# Patient Record
Sex: Male | Born: 1935
Health system: Southern US, Community
[De-identification: ages and names within clinical notes are randomized; demographics above are authoritative.]

## PROBLEM LIST (undated history)

## (undated) DIAGNOSIS — F0280 Dementia in other diseases classified elsewhere without behavioral disturbance: Secondary | ICD-10-CM

## (undated) DIAGNOSIS — G309 Alzheimer's disease, unspecified: Secondary | ICD-10-CM

## (undated) DIAGNOSIS — N183 Chronic kidney disease, stage 3 unspecified: Secondary | ICD-10-CM

## (undated) DIAGNOSIS — I10 Essential (primary) hypertension: Secondary | ICD-10-CM

## (undated) DIAGNOSIS — C61 Malignant neoplasm of prostate: Secondary | ICD-10-CM

## (undated) DIAGNOSIS — I5020 Unspecified systolic (congestive) heart failure: Secondary | ICD-10-CM

## (undated) DIAGNOSIS — I4819 Other persistent atrial fibrillation: Secondary | ICD-10-CM

## (undated) DIAGNOSIS — C189 Malignant neoplasm of colon, unspecified: Secondary | ICD-10-CM

## (undated) HISTORY — PX: HERNIA REPAIR: SHX51

## (undated) HISTORY — PX: COLON SURGERY: SHX602

---

## 2014-05-17 ENCOUNTER — Inpatient Hospital Stay: Payer: Self-pay | Admitting: Internal Medicine

## 2014-05-17 LAB — COMPREHENSIVE METABOLIC PANEL
Albumin: 2.4 g/dL — ABNORMAL LOW (ref 3.4–5.0)
Alkaline Phosphatase: 67 U/L
Anion Gap: 10 (ref 7–16)
BUN: 102 mg/dL — ABNORMAL HIGH (ref 7–18)
Bilirubin,Total: 0.5 mg/dL (ref 0.2–1.0)
Calcium, Total: 8.9 mg/dL (ref 8.5–10.1)
Chloride: 117 mmol/L — ABNORMAL HIGH (ref 98–107)
Co2: 20 mmol/L — ABNORMAL LOW (ref 21–32)
Creatinine: 3.14 mg/dL — ABNORMAL HIGH (ref 0.60–1.30)
EGFR (African American): 21 — ABNORMAL LOW
GFR CALC NON AF AMER: 18 — AB
Glucose: 94 mg/dL (ref 65–99)
OSMOLALITY: 324 (ref 275–301)
Potassium: 5.5 mmol/L — ABNORMAL HIGH (ref 3.5–5.1)
SGOT(AST): 38 U/L — ABNORMAL HIGH (ref 15–37)
SGPT (ALT): 18 U/L
Sodium: 147 mmol/L — ABNORMAL HIGH (ref 136–145)
Total Protein: 7.4 g/dL (ref 6.4–8.2)

## 2014-05-17 LAB — URINALYSIS, COMPLETE
Bilirubin,UR: NEGATIVE
Glucose,UR: NEGATIVE mg/dL (ref 0–75)
Hyaline Cast: 59
Ketone: NEGATIVE
Nitrite: NEGATIVE
Ph: 5 (ref 4.5–8.0)
Protein: 100
RBC,UR: 24 /HPF (ref 0–5)
Specific Gravity: 1.015 (ref 1.003–1.030)
Squamous Epithelial: 1
Transitional Epi: 6
WBC UR: 2133 /HPF (ref 0–5)

## 2014-05-17 LAB — CBC WITH DIFFERENTIAL/PLATELET
BASOS ABS: 0.1 10*3/uL (ref 0.0–0.1)
Basophil %: 0.8 %
EOS PCT: 0.1 %
Eosinophil #: 0 10*3/uL (ref 0.0–0.7)
HCT: 38.7 % — ABNORMAL LOW (ref 40.0–52.0)
HGB: 12 g/dL — AB (ref 13.0–18.0)
Lymphocyte #: 1.1 10*3/uL (ref 1.0–3.6)
Lymphocyte %: 12.1 %
MCH: 28.4 pg (ref 26.0–34.0)
MCHC: 31 g/dL — AB (ref 32.0–36.0)
MCV: 92 fL (ref 80–100)
Monocyte #: 1.1 x10 3/mm — ABNORMAL HIGH (ref 0.2–1.0)
Monocyte %: 11.7 %
NEUTROS ABS: 7.1 10*3/uL — AB (ref 1.4–6.5)
Neutrophil %: 75.3 %
Platelet: 116 10*3/uL — ABNORMAL LOW (ref 150–440)
RBC: 4.23 10*6/uL — AB (ref 4.40–5.90)
RDW: 20.5 % — ABNORMAL HIGH (ref 11.5–14.5)
WBC: 9.5 10*3/uL (ref 3.8–10.6)

## 2014-05-17 LAB — TROPONIN I
Troponin-I: 0.52 ng/mL — ABNORMAL HIGH
Troponin-I: 0.52 ng/mL — ABNORMAL HIGH

## 2014-05-17 LAB — BASIC METABOLIC PANEL
Anion Gap: 8 (ref 7–16)
BUN: 103 mg/dL — AB (ref 7–18)
CALCIUM: 9.4 mg/dL (ref 8.5–10.1)
CO2: 24 mmol/L (ref 21–32)
CREATININE: 2.98 mg/dL — AB (ref 0.60–1.30)
Chloride: 113 mmol/L — ABNORMAL HIGH (ref 98–107)
EGFR (Non-African Amer.): 19 — ABNORMAL LOW
GFR CALC AF AMER: 22 — AB
GLUCOSE: 120 mg/dL — AB (ref 65–99)
Osmolality: 322 (ref 275–301)
POTASSIUM: 5.1 mmol/L (ref 3.5–5.1)
Sodium: 145 mmol/L (ref 136–145)

## 2014-05-17 LAB — CK-MB: CK-MB: 3.2 ng/mL (ref 0.5–3.6)

## 2014-05-17 LAB — CK TOTAL AND CKMB (NOT AT ARMC)
CK, Total: 283 U/L
CK-MB: 2.1 ng/mL (ref 0.5–3.6)

## 2014-05-17 LAB — TSH: THYROID STIMULATING HORM: 3.77 u[IU]/mL

## 2014-05-17 LAB — CLOSTRIDIUM DIFFICILE(ARMC)

## 2014-05-18 LAB — CBC WITH DIFFERENTIAL/PLATELET
BASOS ABS: 0.1 10*3/uL (ref 0.0–0.1)
Basophil %: 0.6 %
Eosinophil #: 0 10*3/uL (ref 0.0–0.7)
Eosinophil %: 0.4 %
HCT: 34.3 % — ABNORMAL LOW (ref 40.0–52.0)
HGB: 10.7 g/dL — AB (ref 13.0–18.0)
LYMPHS PCT: 9.3 %
Lymphocyte #: 0.8 10*3/uL — ABNORMAL LOW (ref 1.0–3.6)
MCH: 28.5 pg (ref 26.0–34.0)
MCHC: 31.3 g/dL — ABNORMAL LOW (ref 32.0–36.0)
MCV: 91 fL (ref 80–100)
MONO ABS: 1 x10 3/mm (ref 0.2–1.0)
Monocyte %: 10.7 %
Neutrophil #: 7.1 10*3/uL — ABNORMAL HIGH (ref 1.4–6.5)
Neutrophil %: 79 %
PLATELETS: 114 10*3/uL — AB (ref 150–440)
RBC: 3.77 10*6/uL — AB (ref 4.40–5.90)
RDW: 20 % — ABNORMAL HIGH (ref 11.5–14.5)
WBC: 9.1 10*3/uL (ref 3.8–10.6)

## 2014-05-18 LAB — BASIC METABOLIC PANEL
ANION GAP: 11 (ref 7–16)
BUN: 107 mg/dL — AB (ref 7–18)
CALCIUM: 8.5 mg/dL (ref 8.5–10.1)
CHLORIDE: 110 mmol/L — AB (ref 98–107)
CO2: 25 mmol/L (ref 21–32)
CREATININE: 2.96 mg/dL — AB (ref 0.60–1.30)
GFR CALC AF AMER: 22 — AB
GFR CALC NON AF AMER: 19 — AB
Glucose: 145 mg/dL — ABNORMAL HIGH (ref 65–99)
Osmolality: 327 (ref 275–301)
Potassium: 4.8 mmol/L (ref 3.5–5.1)
Sodium: 146 mmol/L — ABNORMAL HIGH (ref 136–145)

## 2014-05-18 LAB — CK-MB: CK-MB: 3.6 ng/mL (ref 0.5–3.6)

## 2014-05-18 LAB — MAGNESIUM: Magnesium: 2.3 mg/dL

## 2014-05-18 LAB — TROPONIN I: Troponin-I: 0.47 ng/mL — ABNORMAL HIGH

## 2014-05-19 LAB — URINE CULTURE

## 2014-05-19 LAB — BASIC METABOLIC PANEL
Anion Gap: 5 — ABNORMAL LOW (ref 7–16)
BUN: 97 mg/dL — ABNORMAL HIGH (ref 7–18)
CHLORIDE: 105 mmol/L (ref 98–107)
CO2: 23 mmol/L (ref 21–32)
Calcium, Total: 8.1 mg/dL — ABNORMAL LOW (ref 8.5–10.1)
Creatinine: 2.59 mg/dL — ABNORMAL HIGH (ref 0.60–1.30)
EGFR (African American): 26 — ABNORMAL LOW
EGFR (Non-African Amer.): 22 — ABNORMAL LOW
Glucose: 120 mg/dL — ABNORMAL HIGH (ref 65–99)
Osmolality: 298 (ref 275–301)
Potassium: 3.9 mmol/L (ref 3.5–5.1)
Sodium: 133 mmol/L — ABNORMAL LOW (ref 136–145)

## 2015-01-26 NOTE — Discharge Summary (Signed)
PATIENT NAMELARENCE, Dale Williams MR#:  [id number] DATE OF BIRTH:  17-Mar-1931  DATE OF ADMISSION:  05/17/2014 DATE OF DISCHARGE:  05/19/2014  ADMISSION DIAGNOSIS:  Acute renal failure.   DISCHARGE DIAGNOSES: 1.  Acute renal failure, likely the patient has chronic kidney disease, stage 3.  2.  Escherichia coli, urinary tract infection, pansensitive.   CONSULTATIONS:  Nephrology.   PERTINENT LABORATORIES AT DISCHARGE: Sodium 133, potassium 3.9, chloride 105, bicarbonate 23, BUN 97, creatinine 2.5, glucose 127,   Renal ultrasound showed no evidence of hydronephrosis.   Urine cultures positive for Escherichia coli, pansensitive.   HOSPITAL COURSE: An 81 year old male who was brought in on August 13, for altered mental status. For further details, please refer to the H and P.  1.  Acute renal failure. The patient was noted to have acute renal failure. His creatinine at admission was 2.99. I suspect he has some underlying chronic kidney disease as per the nephrology consultation, as he is on calcitriol as an outpatient. His medications as an outpatient are also renally dosed. His creatinine did improve with some IV fluids to 2.6, but again we feel that he probably has stage 3 chronic kidney disease. He will follow up next week with Dr. Clayborn Bigness for further work-up and evaluation.  2.  Confusion, likely secondary to his dementia.  I did ask psychiatry to see the patient in consultation to assess his ability to make decisions. He is capable of making his own medical decisions, as well as placement decisions as per psychiatry consultation.  His depression seems to be very mild.  3.  Hyperkalemia on admission resolved.  4. Escherichia coli urinary tract infection was pansensitive. The patient is ALLERGIC TO PENICILLIN so he is placed on Levaquin.   5.  History of hypertension. His blood pressure medications have been on hold due to low-normal blood pressures.   DISCHARGE MEDICATIONS: 1.  Allopurinol 300  mg daily.  2.  Calcitriol 0.5 mg daily.  3.  Donepezil 10 mg at bedtime.  4.  Tamsulosin 0.4 mg daily.  5.  Levaquin 250 mg q. 48 hours x10 days.   DISCHARGE INSTRUCTIONS:  The patient will stop taking Norvasc, lisinopril for now due to low-normal blood pressure.   DISCHARGE DIET: Regular. Discharge Supplement: Ensure t.i.d.   DISCHARGE ACTIVITY: As tolerated.   DISCHARGE REFERRAL: Physical therapy.   FOLLOWUP:  The patient should follow up with Dr. Lockie Mola in 1-2 weeks and Dr. Clayborn Bigness in 1-2 days.  Blood pressure should be checked daily.    TIME SPENT: 35 minutes.  DISPOSITION:  The patient is stable for discharge.    ____________________________ Towanda Hornstein P. Benjie Karvonen, MD spm:DT D: 05/19/2014 10:00:56 ET T: 05/19/2014 13:47:15 ET JOB#: [id number]  cc: Olie Dibert P. Benjie Karvonen, MD, <Dictator> Donell Beers Alvena Kiernan MD ELECTRONICALLY SIGNED 05/19/2014 21:24

## 2015-01-26 NOTE — H&P (Signed)
PATIENT NAME:  Dale Williams, Dale Williams MR#:  [id number] DATE OF BIRTH:  October 12, 1930  DATE OF ADMISSION:  05/17/2014  PRIMARY CARE PHYSICIAN:  Wells Guiles B. Petra Kuba, MD.  REQUESTING PHYSICIAN: Conni Slipper, MD.  CHIEF COMPLAINT:  Altered mental status.   HISTORY OF PRESENT ILLNESS: The patient is an 81 year old male, who lives alone and was found by his neighbor, as he was not coming out of his home for several days.  They opened the door was found sitting on his couch, seemed confused and dehydrated. EMS was called along with police and he was brought down to the Emergency Department. He was complaining of pain with urination, but was not understanding why he is being brought to the hospital.  While  in the ED, he denies any other symptoms and he is being admitted for further evaluation and management, as his creatinine was found to be at 3.14 with BUN of 102, sodium 147 and potassium of 5.5. His troponin was also 0.52.   PAST MEDICAL HISTORY: 1.  Cancer.  2.  Prostrate cancer.  3.  Hypertension.   ALLERGIES: PENICILLIN.   SOCIAL HISTORY: Smokes 1 pack of cigarettes daily for many years. Occasional alcohol. No IV drugs of abuse. He used to Energy manager.   FAMILY HISTORY: Mother had heart disease.   MEDICATIONS AT HOME: 1.  Tamsulosin 0.4 mg p.o. daily.  2.  Lisinopril 5 mg p.o. daily.  3.  Donepezil 10 mg p.o. daily. 4.  Calcitriol 0.25 mcg p.o. daily.  5.  Amlodipine 10 mg p.o. daily.  6.  Allopurinol 300 mg p.o. daily.   REVIEW OF SYSTEMS:  CONSTITUTIONAL: No fever.  Positive fatigue and weakness.  EYES: No blurred or double vision.  ENT: No tinnitus or ear pain.  RESPIRATORY: No cough, hemoptysis. CARDIOVASCULAR:  No chest pain, orthopnea.  GASTROINTESTINAL:  No nausea, vomiting or diarrhea.  GENITOURINARY: Positive for dysuria and frequency. No hematuria.  ENDOCRINE: No polyuria or nocturia.  HEMATOLOGY: No anemia or bruising.  SKIN: No rash or lesion.  MUSCULOSKELETAL: No  arthritis or muscle cramp.  NEUROLOGIC: No tingling, numbness, weakness.  PSYCHIATRIC: No history of anxiety or depression.   PHYSICAL EXAMINATION: VITAL SIGNS: Temperature 98.1, heart rate 95 per minute, respirations 13 per minute, blood pressure 106/72 mmHg, saturating 98% on room air.  GENERAL: The patient is an 81 year old male lying in the bed comfortably without any acute distress.  EYES: Pupils equal, round, and reactive to light and accommodation. No scleral icterus. He does have scleral pallor.  HEENT: Head atraumatic, normocephalic. Oropharynx and nasopharynx dry and clear.  NECK: Supple. No jugular venous distention. No thyroid enlargement or tenderness.  LUNGS: Decreased breath sounds at the bases. No wheezing, rales, rhonchi, or crepitation.  CARDIOVASCULAR: S1, S2 normal. No murmurs, rubs or gallop.  ABDOMEN: Soft, nontender, nondistended. Bowel sounds present. No organomegaly or mass.  EXTREMITIES: No peripheral edema, cyanosis or clubbing.  NEUROLOGIC: Cranial nerves through II-XII intact. Muscle strength 4/5 in all extremities. Sensation intact.  PSYCHIATRIC: The patient seems alert, but not oriented. He told me the year is 1521 and did not know the current president.  He stated President Janeice Robinson.  He could tell me his birthday and also that he is in the hospital, but could not tell me the name.  SKIN: No obvious rash, lesion or ulcer.   LABORATORY PANEL: Sodium 147, potassium 5.5, chloride 117, CO2 of 20 BUN 102, creatinine 3.14. Normal LFTs except AST of 38.   Normal TSH. Normal  CBC, except hemoglobin of 12.0, hematocrit 38.7, platelets 116,000.  Troponin 0.52.   Urinalysis showed 3+ bacteria.  WBC of 2133, leukocyte esterase 3+. WBC in clumps present.   RADIOLOGY DATA:  Chest x-ray in the ED showed borderline cardiomegaly.  Otherwise,  no acute cardiopulmonary disease.   CT scan of the head without contrast in the ED showed no acute intracranial abnormality. Generalized  atrophy seen.   CT scan of the abdomen and pelvis in the ED showed severe atherosclerosis. Prostatomegaly, bladder wall  thickening and stranding, compatible with cystitis. Left renal cyst. Biliary sludge or stone in the gallbladder.   EKG in the ER showed sinus rhythm with occasional PVCs, right bundle branch block. No major ST-T changes.   IMPRESSION AND PLAN: 1.  Acute renal failure, likely prerenal. We will hold nephrotoxic medication. His bladder is decompressed based on the CT scan.  Will hold her Foley at this time, unless he does not make any urine in which case I will do bladder scan and request Foley at that point.  Will obtain renal ultrasound.  Consider nephro consult if no improvement in renal function.  Will start him on aggressive IV hydration.  2.  Confusion, likely due to dehydration and acute renal failure with possible underlying dementia. 3.  Hyperkalemia/hyponatremia, likely due to severe dehydration. We will hydrate him with IV fluids and recheck his electrolytes.  4.  Elevated troponin, likely due to supply/demand ischemia. We will do serial trend.  5.  Cystitis/urinary tract infection based on urinalysis. We will obtain urine culture, start him on Levaquin.   CODE STATUS: Full code.   TOTAL TIME TAKING CARE OF THIS PATIENT: 55 minutes.   ____________________________ Lucina Mellow. Manuella Ghazi, MD vss:DT D: 05/17/2014 20:07:52 ET T: 05/17/2014 20:49:53 ET JOB#: [id number]  cc: Billye Nydam S. Manuella Ghazi, MD, <Dictator> Barbarann Ehlers. Petra Kuba, MD [place] MD ELECTRONICALLY SIGNED 05/18/2014 12:40

## 2015-01-26 NOTE — Consult Note (Signed)
PATIENT NAME:  Dale Williams, Dale Williams MR#:  [id number] DATE OF BIRTH:  Jun 12, 1931  DATE OF CONSULTATION:  05/18/2014  REFERRING PHYSICIAN:   CONSULTING PHYSICIAN:  Gonzella Lex, MD  IDENTIFYING INFORMATION AND REASON FOR CONSULTATION: An 81 year old man brought into the hospital was dehydration. Consultation for "capacity."   HISTORY OF PRESENT ILLNESS: Information obtained from the patient and the chart. We have no previous history here at the hospital prior to today. Evidently, neighbors were concerned that they had not seen him in several days and so they went into his house. He is described as having been confused when they found him there and he was brought to the hospital. I do not have any other detail presented here to suggest that he had been doing a 81 job taking care of himself at home. On interview today, the patient he is aware that he was brought in because people were concerned that they had not seen him in several days. He disputes this, saying that he had been going about his regular routine for the last few days. Denies depression. Denies changing his habits. Denies any change in his appetite. He says he had not been feeling sick recently. Essentially has no acute complaints at all.   PAST PSYCHIATRIC HISTORY: He denies any history of any psychiatric treatment in the past at all.   PAST MEDICAL HISTORY: History of colon cancer, which has been treated.  Enlarged prostate, possible prostate cancer.  High blood pressure.   SOCIAL HISTORY: Lives by himself. He says that he has a sister in law in the area who he sees regularly. He is a retired Administrator. Has adult children, but seems to stay at only occasional contact with them.   FAMILY HISTORY: No family history of mental illness.   SUBSTANCE ABUSE HISTORY: Says that he used to drink quite a bit but stopped some time ago. He is vague about how long that was. Says he does not drink now and does not abuse any other drugs.   CURRENT  MEDICATIONS: He tells me that he takes a bunch of medicine at home, but he cannot tell me the names of any them. He is aware that some of them are for high blood pressure. Cannot remember the name of the doctor.   MENTAL STATUS EXAMINATION: Adequately groomed gentleman who looks his stated age or younger. Cooperative with the interview. Very pleasant to interact with. Made good eye contact. Psychomotor activity was normal. I would note that when we came into the room, myself and a male student, the patient's genitals were exposed. It took him almost a minute to recognize this and to cover himself up. He has somewhat decreased speech rate, but normal tone. His affect is euthymic, pleasant, at times seems slightly false in its cheerfulness. Mood is stated as being okay. Thoughts are scattered and a bit disorganized. Superficially seems intact, but he tends to repeat himself frequently. He made the same mistake in conversation a few times without remembering he had done it before. Admits that he is not completely clear on why he is in the hospital. He can remember zero out of 3 objects at 3 minutes. He is alert and oriented x 4; however, knowing where he is here and the year and date. Denies any suicidal or homicidal ideation. Denies any hallucinations.   LABORATORY RESULTS: Today, magnesium 2.3. Creatinine still elevated 2.96, BUN elevated at 107. Sodium elevated at 146, glucose elevated at 145. Hematocrit 34, platelet count low at  114,000.   ASSESSMENT: An 81 year old gentleman about whom we have relatively little data. He is clearly demented to some degree, although probably only mildly. His memory is poor, but he knows where he lives and describes his daily routine well, and he interacts in an appropriate manner. There is no sign of any mood or psychotic illnesses. The question is about capacity to make decision to go home. In this case, that particularly difficult because I know very little about his home.  It is unclear whether his home situation is actually dangerous. He is possible that the patient had just been acutely sick and now has returned to his baseline. It is possible that a person with this degree of dementia may still be able to take care of themselves reasonably well. Furthermore, in this case, the question is fortunately moot, because the patient is agreeable to the plan to go to a rehabilitation facility. I asked him several times and he tells me that there is a rehabilitation facility here in town, possibly in [place] that has accepted him and he is agreeable to going there. He says that he is happy to do that if people do not feel comfortable with him going right back home again. Thus, I do not think we have to worry about making any decision. If it did come down to it, we would probably have to say that by default he does have capacity to make a decision to go home. This is in light of the fact that we do not have clear evidence that there is dangerousness that he would be facing. If he does go back home eventually and there is further concern, an adult protective services referral can be made.   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   SECONDARY DIAGNOSES:  AXIS I: No further.  AXIS II: No diagnosis.  AXIS III: High blood pressure, renal insufficiency, possible diabetes, history of prostate cancer, and history of colon cancer.  AXIS IV: Moderate from chronic isolation.  AXIS V: Functioning at time of evaluation, 52.     ____________________________ Gonzella Lex, MD jtc:TT D: 05/18/2014 18:34:14 ET T: 05/18/2014 19:53:11 ET JOB#: [id number]  cc: Gonzella Lex, MD, <Dictator> Gonzella Lex MD ELECTRONICALLY SIGNED 06/15/2014 16:57

## 2018-06-04 ENCOUNTER — Ambulatory Visit
Admission: EM | Admit: 2018-06-04 | Discharge: 2018-06-04 | Disposition: A | Discharge status: Home / Self Care | Payer: Medicare HMO | Attending: Family Medicine | Admitting: Family Medicine

## 2018-06-04 ENCOUNTER — Ambulatory Visit (INDEPENDENT_AMBULATORY_CARE_PROVIDER_SITE_OTHER): Payer: Medicare HMO

## 2018-06-04 ENCOUNTER — Other Ambulatory Visit: Payer: Self-pay

## 2018-06-04 DIAGNOSIS — R9431 Abnormal electrocardiogram [ECG] [EKG]: Secondary | ICD-10-CM | POA: Insufficient documentation

## 2018-06-04 DIAGNOSIS — I4510 Unspecified right bundle-branch block: Secondary | ICD-10-CM | POA: Diagnosis not present

## 2018-06-04 DIAGNOSIS — R0602 Shortness of breath: Secondary | ICD-10-CM

## 2018-06-04 DIAGNOSIS — E860 Dehydration: Secondary | ICD-10-CM | POA: Diagnosis not present

## 2018-06-04 DIAGNOSIS — R410 Disorientation, unspecified: Secondary | ICD-10-CM | POA: Diagnosis not present

## 2018-06-04 DIAGNOSIS — J449 Chronic obstructive pulmonary disease, unspecified: Secondary | ICD-10-CM | POA: Insufficient documentation

## 2018-06-04 DIAGNOSIS — N183 Chronic kidney disease, stage 3 unspecified: Secondary | ICD-10-CM

## 2018-06-04 DIAGNOSIS — R531 Weakness: Secondary | ICD-10-CM

## 2018-06-04 DIAGNOSIS — R197 Diarrhea, unspecified: Secondary | ICD-10-CM

## 2018-06-04 DIAGNOSIS — I700 Atherosclerosis of aorta: Secondary | ICD-10-CM | POA: Diagnosis not present

## 2018-06-04 DIAGNOSIS — N170 Acute kidney failure with tubular necrosis: Secondary | ICD-10-CM

## 2018-06-04 HISTORY — DX: Malignant neoplasm of prostate: C61

## 2018-06-04 HISTORY — DX: Alzheimer's disease, unspecified: G30.9

## 2018-06-04 HISTORY — DX: Malignant neoplasm of colon, unspecified: C18.9

## 2018-06-04 HISTORY — DX: Dementia in other diseases classified elsewhere, unspecified severity, without behavioral disturbance, psychotic disturbance, mood disturbance, and anxiety: F02.80

## 2018-06-04 HISTORY — DX: Essential (primary) hypertension: I10

## 2018-06-04 LAB — COMPREHENSIVE METABOLIC PANEL
ALT: 16 U/L (ref 0–44)
ANION GAP: 13 (ref 5–15)
AST: 32 U/L (ref 15–41)
Albumin: 3.7 g/dL (ref 3.5–5.0)
Alkaline Phosphatase: 66 U/L (ref 38–126)
BILIRUBIN TOTAL: 0.6 mg/dL (ref 0.3–1.2)
BUN: 84 mg/dL — AB (ref 8–23)
CO2: 21 mmol/L — AB (ref 22–32)
Calcium: 9.9 mg/dL (ref 8.9–10.3)
Chloride: 110 mmol/L (ref 98–111)
Creatinine, Ser: 3.57 mg/dL — ABNORMAL HIGH (ref 0.61–1.24)
GFR calc Af Amer: 17 mL/min — ABNORMAL LOW (ref >60)
GFR calc non Af Amer: 14 mL/min — ABNORMAL LOW (ref >60)
Glucose, Bld: 88 mg/dL (ref 70–99)
POTASSIUM: 4 mmol/L (ref 3.5–5.1)
Sodium: 144 mmol/L (ref 135–145)
TOTAL PROTEIN: 8.1 g/dL (ref 6.5–8.1)

## 2018-06-04 LAB — CBC WITH DIFFERENTIAL/PLATELET
BAND NEUTROPHILS: 0 %
BASOS PCT: 0 %
Basophils Absolute: 0 10*3/uL (ref 0–0.1)
Blasts: 0 %
EOS ABS: 0 10*3/uL (ref 0–0.7)
EOS PCT: 1 %
HEMATOCRIT: 34.4 % — AB (ref 40.0–52.0)
Hemoglobin: 11.3 g/dL — ABNORMAL LOW (ref 13.0–18.0)
LYMPHS ABS: 0.9 10*3/uL — AB (ref 1.0–3.6)
LYMPHS PCT: 19 %
MCH: 30.1 pg (ref 26.0–34.0)
MCHC: 32.7 g/dL (ref 32.0–36.0)
MCV: 91.8 fL (ref 80.0–100.0)
MONO ABS: 0.1 10*3/uL — AB (ref 0.2–1.0)
MYELOCYTES: 0 %
Metamyelocytes Relative: 0 %
Monocytes Relative: 3 %
Neutro Abs: 3.6 10*3/uL (ref 1.4–6.5)
Neutrophils Relative %: 77 %
OTHER: 0 %
PROMYELOCYTES RELATIVE: 0 %
Platelets: 116 10*3/uL — ABNORMAL LOW (ref 150–440)
RBC: 3.75 MIL/uL — ABNORMAL LOW (ref 4.40–5.90)
RDW: 20.2 % — AB (ref 11.5–14.5)
WBC: 4.6 10*3/uL (ref 3.8–10.6)
nRBC: 0 /100 WBC

## 2018-06-04 LAB — URINALYSIS, COMPLETE (UACMP) WITH MICROSCOPIC
Glucose, UA: NEGATIVE mg/dL
HGB URINE DIPSTICK: NEGATIVE
KETONES UR: 15 mg/dL — AB
LEUKOCYTES UA: NEGATIVE
Nitrite: NEGATIVE
Protein, ur: 100 mg/dL — AB
RBC / HPF: NONE SEEN RBC/hpf (ref 0–5)
Specific Gravity, Urine: >1.03 — ABNORMAL HIGH (ref 1.005–1.030)
pH: 5 (ref 5.0–8.0)

## 2018-06-04 LAB — GLUCOSE, CAPILLARY: Glucose-Capillary: 133 mg/dL — ABNORMAL HIGH (ref 70–99)

## 2018-06-04 NOTE — Discharge Instructions (Signed)
Recommend patient go to Emergency Department for further evaluation and management °

## 2018-06-04 NOTE — ED Triage Notes (Signed)
Pt with alzheimer's but lives alone with family member checking in on him. Today the family member went over to check on him and he had been incontinent of stool, which is unusual. Family member states he then was acting like it was difficult to comprehend some commands. Pt is "70%" better now per his caregiver.

## 2018-10-14 ENCOUNTER — Other Ambulatory Visit
Admission: RE | Admit: 2018-10-14 | Discharge: 2018-10-14 | Disposition: A | Discharge status: Home / Self Care | Payer: Medicare HMO | Source: Ambulatory Visit | Attending: Family Medicine | Admitting: Family Medicine

## 2018-10-14 DIAGNOSIS — N390 Urinary tract infection, site not specified: Secondary | ICD-10-CM | POA: Diagnosis present

## 2018-10-14 LAB — URINALYSIS, COMPLETE (UACMP) WITH MICROSCOPIC
BILIRUBIN URINE: NEGATIVE
Bacteria, UA: NONE SEEN
Glucose, UA: NEGATIVE mg/dL
Hgb urine dipstick: NEGATIVE
Ketones, ur: NEGATIVE mg/dL
Leukocytes, UA: NEGATIVE
NITRITE: NEGATIVE
Protein, ur: NEGATIVE mg/dL
SPECIFIC GRAVITY, URINE: 1.012 (ref 1.005–1.030)
WBC, UA: NONE SEEN WBC/hpf (ref 0–5)
pH: 5 (ref 5.0–8.0)

## 2018-10-15 LAB — URINE CULTURE: CULTURE: NO GROWTH

## 2018-12-05 ENCOUNTER — Emergency Department: Payer: Medicare HMO

## 2018-12-05 ENCOUNTER — Inpatient Hospital Stay
Admission: EM | Admit: 2018-12-05 | Discharge: 2018-12-09 | DRG: 193 | Disposition: A | Discharge status: Skilled Nursing Facility | Payer: Medicare HMO | Attending: Internal Medicine | Admitting: Internal Medicine

## 2018-12-05 ENCOUNTER — Other Ambulatory Visit: Payer: Self-pay

## 2018-12-05 ENCOUNTER — Encounter: Payer: Self-pay | Admitting: Emergency Medicine

## 2018-12-05 DIAGNOSIS — N400 Enlarged prostate without lower urinary tract symptoms: Secondary | ICD-10-CM | POA: Diagnosis present

## 2018-12-05 DIAGNOSIS — Z8546 Personal history of malignant neoplasm of prostate: Secondary | ICD-10-CM

## 2018-12-05 DIAGNOSIS — Z79899 Other long term (current) drug therapy: Secondary | ICD-10-CM

## 2018-12-05 DIAGNOSIS — J181 Lobar pneumonia, unspecified organism: Secondary | ICD-10-CM

## 2018-12-05 DIAGNOSIS — I4819 Other persistent atrial fibrillation: Secondary | ICD-10-CM | POA: Diagnosis present

## 2018-12-05 DIAGNOSIS — R4182 Altered mental status, unspecified: Secondary | ICD-10-CM | POA: Diagnosis not present

## 2018-12-05 DIAGNOSIS — E875 Hyperkalemia: Secondary | ICD-10-CM | POA: Diagnosis present

## 2018-12-05 DIAGNOSIS — F0280 Dementia in other diseases classified elsewhere without behavioral disturbance: Secondary | ICD-10-CM | POA: Diagnosis present

## 2018-12-05 DIAGNOSIS — D638 Anemia in other chronic diseases classified elsewhere: Secondary | ICD-10-CM | POA: Diagnosis present

## 2018-12-05 DIAGNOSIS — G309 Alzheimer's disease, unspecified: Secondary | ICD-10-CM | POA: Diagnosis present

## 2018-12-05 DIAGNOSIS — R531 Weakness: Secondary | ICD-10-CM

## 2018-12-05 DIAGNOSIS — Z87891 Personal history of nicotine dependence: Secondary | ICD-10-CM

## 2018-12-05 DIAGNOSIS — D61818 Other pancytopenia: Secondary | ICD-10-CM | POA: Diagnosis present

## 2018-12-05 DIAGNOSIS — J189 Pneumonia, unspecified organism: Principal | ICD-10-CM | POA: Diagnosis present

## 2018-12-05 DIAGNOSIS — Y95 Nosocomial condition: Secondary | ICD-10-CM | POA: Diagnosis present

## 2018-12-05 DIAGNOSIS — R296 Repeated falls: Secondary | ICD-10-CM | POA: Diagnosis present

## 2018-12-05 DIAGNOSIS — L8990 Pressure ulcer of unspecified site, unspecified stage: Secondary | ICD-10-CM

## 2018-12-05 DIAGNOSIS — Z8701 Personal history of pneumonia (recurrent): Secondary | ICD-10-CM

## 2018-12-05 DIAGNOSIS — G9341 Metabolic encephalopathy: Secondary | ICD-10-CM | POA: Diagnosis present

## 2018-12-05 DIAGNOSIS — N390 Urinary tract infection, site not specified: Secondary | ICD-10-CM | POA: Diagnosis present

## 2018-12-05 DIAGNOSIS — N179 Acute kidney failure, unspecified: Secondary | ICD-10-CM | POA: Diagnosis present

## 2018-12-05 DIAGNOSIS — D509 Iron deficiency anemia, unspecified: Secondary | ICD-10-CM | POA: Diagnosis present

## 2018-12-05 DIAGNOSIS — I5023 Acute on chronic systolic (congestive) heart failure: Secondary | ICD-10-CM | POA: Diagnosis present

## 2018-12-05 DIAGNOSIS — D181 Lymphangioma, any site: Secondary | ICD-10-CM

## 2018-12-05 DIAGNOSIS — I517 Cardiomegaly: Secondary | ICD-10-CM

## 2018-12-05 DIAGNOSIS — N184 Chronic kidney disease, stage 4 (severe): Secondary | ICD-10-CM | POA: Diagnosis present

## 2018-12-05 DIAGNOSIS — E871 Hypo-osmolality and hyponatremia: Secondary | ICD-10-CM | POA: Diagnosis present

## 2018-12-05 DIAGNOSIS — Z85038 Personal history of other malignant neoplasm of large intestine: Secondary | ICD-10-CM

## 2018-12-05 DIAGNOSIS — E872 Acidosis: Secondary | ICD-10-CM | POA: Diagnosis present

## 2018-12-05 DIAGNOSIS — I130 Hypertensive heart and chronic kidney disease with heart failure and stage 1 through stage 4 chronic kidney disease, or unspecified chronic kidney disease: Secondary | ICD-10-CM | POA: Diagnosis present

## 2018-12-05 DIAGNOSIS — I272 Pulmonary hypertension, unspecified: Secondary | ICD-10-CM | POA: Diagnosis present

## 2018-12-05 DIAGNOSIS — R52 Pain, unspecified: Secondary | ICD-10-CM

## 2018-12-05 HISTORY — DX: Chronic kidney disease, stage 3 unspecified: N18.30

## 2018-12-05 HISTORY — DX: Unspecified systolic (congestive) heart failure: I50.20

## 2018-12-05 HISTORY — DX: Chronic kidney disease, stage 3 (moderate): N18.3

## 2018-12-05 HISTORY — DX: Other persistent atrial fibrillation: I48.19

## 2018-12-05 LAB — CBC WITH DIFFERENTIAL/PLATELET
ABS IMMATURE GRANULOCYTES: 0.01 10*3/uL (ref 0.00–0.07)
BASOS PCT: 0 %
Basophils Absolute: 0 10*3/uL (ref 0.0–0.1)
Eosinophils Absolute: 0 10*3/uL (ref 0.0–0.5)
Eosinophils Relative: 1 %
HCT: 29.2 % — ABNORMAL LOW (ref 39.0–52.0)
HEMOGLOBIN: 10.7 g/dL — AB (ref 13.0–17.0)
IMMATURE GRANULOCYTES: 0 %
LYMPHS PCT: 17 %
Lymphs Abs: 0.6 10*3/uL — ABNORMAL LOW (ref 0.7–4.0)
MCH: 28.8 pg (ref 26.0–34.0)
MCHC: 36.6 g/dL — ABNORMAL HIGH (ref 30.0–36.0)
MCV: 78.5 fL — ABNORMAL LOW (ref 80.0–100.0)
MONO ABS: 0.4 10*3/uL (ref 0.1–1.0)
Monocytes Relative: 11 %
NEUTROS ABS: 2.6 10*3/uL (ref 1.7–7.7)
NEUTROS PCT: 71 %
PLATELETS: 134 10*3/uL — AB (ref 150–400)
RBC: 3.72 MIL/uL — ABNORMAL LOW (ref 4.22–5.81)
RDW: 17.2 % — ABNORMAL HIGH (ref 11.5–15.5)
WBC: 3.7 10*3/uL — ABNORMAL LOW (ref 4.0–10.5)
nRBC: 1.1 % — ABNORMAL HIGH (ref 0.0–0.2)

## 2018-12-05 LAB — URINALYSIS, COMPLETE (UACMP) WITH MICROSCOPIC
BILIRUBIN URINE: NEGATIVE
GLUCOSE, UA: NEGATIVE mg/dL
Hgb urine dipstick: NEGATIVE
KETONES UR: NEGATIVE mg/dL
LEUKOCYTE UA: NEGATIVE
Nitrite: NEGATIVE
PH: 5 (ref 5.0–8.0)
PROTEIN: NEGATIVE mg/dL
Specific Gravity, Urine: 1.011 (ref 1.005–1.030)
Squamous Epithelial / LPF: NONE SEEN (ref 0–5)

## 2018-12-05 LAB — BRAIN NATRIURETIC PEPTIDE: B Natriuretic Peptide: 1473 pg/mL — ABNORMAL HIGH (ref 0.0–100.0)

## 2018-12-05 MED ORDER — LEVOFLOXACIN IN D5W 750 MG/150ML IV SOLN
750.0000 mg | Freq: Once | INTRAVENOUS | Status: AC
  Administered 2018-12-05: 750 mg via INTRAVENOUS
  Filled 2018-12-05: qty 150

## 2018-12-05 MED ORDER — SODIUM CHLORIDE 0.9 % IV BOLUS
500.0000 mL | Freq: Once | INTRAVENOUS | Status: AC
  Administered 2018-12-05: 500 mL via INTRAVENOUS

## 2018-12-05 MED ORDER — SODIUM CHLORIDE 0.9 % IV BOLUS
1000.0000 mL | Freq: Once | INTRAVENOUS | Status: AC
  Administered 2018-12-05: 1000 mL via INTRAVENOUS

## 2018-12-05 NOTE — ED Provider Notes (Signed)
[facility] Emergency Department Provider Note  ____________________________________________  Time seen: Approximately 10:13 PM  I have reviewed the triage vital signs and the nursing notes.   HISTORY  Chief Complaint Altered Mental Status    HPI Dale Williams is a 85 y.o. male with a history of advanced Alzheimer's dementia, HTN, CHF, cared for at home by his great great nephew and home health nurses, brought for shaking chills, generalized weakness with inability to walk, cough, and concerns for UTI.  The patient is unable to give any history.  His great great nephew, who is his primary caretaker states that over the last 2 weeks, he has become generally weak and now cannot ambulate without significant assistance.  He has even had some falls without any loss of consciousness or obvious injury other than bruising over the right shoulder.  Last week, he was seen by his PMD, and put on ciprofloxacin for UTI; he has completed the course of antibiotics and has not improved.  2 weeks ago he had diarrhea, which resolved with loperamide; his great great nephew had the same symptoms so they thought it was likely infectious.  Over the past 2 to 3 days, the patient has had not had any nausea vomiting or diarrhea.  Past Medical History:  Diagnosis Date  . Alzheimer's dementia ([place])   . CHF (congestive heart failure) ([place])   . Colon cancer ([place])   . Hypertension   . Irregular heartbeat   . Prostate cancer ([place])   . Renal disease     There are no active problems to display for this patient.   Past Surgical History:  Procedure Laterality Date  . COLON SURGERY    . HERNIA REPAIR      Current Outpatient Rx  . Order #: [id number] Class: Historical Med  . Order #: [id number] Class: Historical Med  . Order #: [id number] Class: Historical Med  . Order #: [id number] Class: Historical Med  . Order #: [id number] Class: Historical Med  . Order #: [id number] Class: Historical Med  .  Order #: [id number] Class: Historical Med  . Order #: [id number] Class: Historical Med  . Order #: [id number] Class: Historical Med    Allergies Penicillins  No family history on file.  Social History Social History   Tobacco Use  . Smoking status: Former Smoker    Types: Cigars  . Smokeless tobacco: Never Used  . Tobacco comment: 2 cigars daily  Substance Use Topics  . Alcohol use: Not Currently  . Drug use: Never    Review of Systems Constitutional: No fever/chills.  Changes in mental status.  Positive generalized weakness. Eyes: Unknown. ENT: No sore throat. No congestion or rhinorrhea. Cardiovascular: Denies chest pain. Denies palpitations. Respiratory: Denies shortness of breath.  Positive cough. Gastrointestinal: No abdominal pain.  No nausea, no vomiting.  Diarrhea 2 weeks ago, now resolved..  No constipation. Genitourinary: Diagnosed with UTI and completed ciprofloxacin course. Musculoskeletal: Negative for back pain. Skin: Negative for rash. Neurological: Negative for headaches. No focal numbness, tingling or weakness.     ____________________________________________   PHYSICAL EXAM:  VITAL SIGNS: ED Triage Vitals  Enc Vitals Group     BP 12/05/18 2114 (!) 110/46     Pulse Rate 12/05/18 2114 (!) 57     Resp 12/05/18 2114 18     Temp 12/05/18 2114 98 F (36.7 C)     Temp Source 12/05/18 2114 Oral     SpO2 12/05/18 2114 100 %     Weight 12/05/18 2110 157 lb (71.2 kg)  Height 12/05/18 2110 6\' 3"  (1.905 m)     Head Circumference --      Peak Flow --      Pain Score 12/05/18 2110 0     Pain Loc --      Pain Edu? --      Excl. in [place]? --     Constitutional: The patient is alert but mostly nonverbal. Eyes: Conjunctivae are normal.  EOMI. No scleral icterus.  No eye discharge. Head: Atraumatic. Nose: No congestion/rhinnorhea. Mouth/Throat: Mucous membranes are moist.  Neck: No stridor.  Supple.  No meningismus. Cardiovascular: Normal rate, regular  rhythm. No murmurs, rubs or gallops.  Respiratory: Normal respiratory effort.  No accessory muscle use or retractions.  The patient has end expiratory wheezing which is very mild.  He has rales in the left lung base. Gastrointestinal: Soft, nontender and nondistended.  No guarding or rebound.  No peritoneal signs. Musculoskeletal: No LE edema. No ttp in the calves or palpable cords.  Negative Homan's sign. Neurologic:  A&Ox3.  Speech is clear.  Face and smile are symmetric.  EOMI.  Moves all extremities well. Skin: The patient's right arm has a skin tear on the medial aspect of his forearm without any overlying erythema, purulence or fluctuance. Psychiatric: Mood and affect are normal.   ____________________________________________   LABS (all labs ordered are listed, but only abnormal results are displayed)  Labs Reviewed  CBC WITH DIFFERENTIAL/PLATELET - Abnormal; Notable for the following components:      Result Value   WBC 3.7 (*)    RBC 3.72 (*)    Hemoglobin 10.7 (*)    HCT 29.2 (*)    MCV 78.5 (*)    MCHC 36.6 (*)    RDW 17.2 (*)    Platelets 134 (*)    nRBC 1.1 (*)    Lymphs Abs 0.6 (*)    All other components within normal limits  URINALYSIS, COMPLETE (UACMP) WITH MICROSCOPIC - Abnormal; Notable for the following components:   Color, Urine YELLOW (*)    APPearance HAZY (*)    Bacteria, UA RARE (*)    All other components within normal limits  BRAIN NATRIURETIC PEPTIDE - Abnormal; Notable for the following components:   B Natriuretic Peptide 1,473.0 (*)    All other components within normal limits  CULTURE, BLOOD (ROUTINE X 2)  CULTURE, BLOOD (ROUTINE X 2)  URINE CULTURE  LACTIC ACID, PLASMA  LACTIC ACID, PLASMA  INFLUENZA PANEL BY PCR (TYPE A & B)  COMPREHENSIVE METABOLIC PANEL  TROPONIN I   ____________________________________________  EKG  ED ECG REPORT I, Anne-Caroline Mariea Clonts, the attending physician, personally viewed and interpreted this ECG.   Date:  12/05/2018  EKG Time: 2115  Rate: 70  Rhythm: This has a poor baseline tracing in his rhythm is difficult to see, but appears to be sinus.  Axis: leftward  Intervals:none  ST&T Change: No STEMI  Repeat EKG:  ED ECG REPORT I, Anne-Caroline Mariea Clonts, the attending physician, personally viewed and interpreted this ECG.   Date: 12/05/2018  EKG Time: 2131  Rate: 67  Rhythm: normal sinus rhythm; LBBB  Axis: leftward  Intervals:none  ST&T Change: No STEMI  This EKG is compared to 06/04/2018 which also shows a bundle branch block and is grossly unchanged in morphology.  ____________________________________________  RADIOLOGY  Ct Head Wo Contrast  Result Date: 12/05/2018 CLINICAL DATA:  Subacute neurologic deficit. Confusion. EXAM: CT HEAD WITHOUT CONTRAST TECHNIQUE: Contiguous axial images were obtained from the base of  the skull through the vertex without intravenous contrast. COMPARISON:  Head CT 05/17/2014 FINDINGS: Brain: Thin left subdural hygroma measures up to 5 mm primarily in the frontal region. No evidence of hemorrhagic component. There is approximately 3 mm left right midline shift. No cyst new from prior exam. No hemorrhage or evidence of acute ischemia. Generalized atrophy with mild chronic small vessel ischemia. No hydrocephalus. Vascular: Atherosclerosis of skullbase vasculature without hyperdense vessel or abnormal calcification. Skull: No fracture or focal lesion. Sinuses/Orbits: Paranasal sinuses and mastoid air cells are clear. The visualized orbits are unremarkable. Other: None. IMPRESSION: 1. Thin left subdural hygroma measures up to 5 mm primarily in the frontal region. There is 3 mm right midline shift. No evidence of hemorrhagic component. This is age indeterminate, but new from 05/17/2014 comparison. 2. Generalized atrophy and chronic small vessel ischemia. Electronically Signed   By: Keith Rake M.D.   On: 12/05/2018 22:59   Dg Chest Portable 1 View  Result Date:  12/05/2018 CLINICAL DATA:  Cough. Confusion. Shortness of breath. EXAM: PORTABLE CHEST 1 VIEW COMPARISON:  Radiographs 06/04/2018 FINDINGS: Lower lung volumes from prior exam. Progressive cardiomegaly with slight globular configuration. Aortic atherosclerosis. Patchy bibasilar opacities. No large pleural effusion. No pneumothorax. No acute osseous abnormalities. IMPRESSION: 1. Low lung volumes with progressive cardiomegaly over the past 7 months. Globular configuration of the cardiac silhouette can be seen with pericardial effusion. 2. Patchy bibasilar opacities, left greater than right. This may represent atelectasis, aspiration, or pneumonia. 3.  Aortic Atherosclerosis (ICD10-I70.0). Electronically Signed   By: Keith Rake M.D.   On: 12/05/2018 21:47    ____________________________________________   PROCEDURES  Procedure(s) performed: None  Procedures  Critical Care performed: Yes, see critical care note(s) ____________________________________________   INITIAL IMPRESSION / ASSESSMENT AND PLAN / ED COURSE  Pertinent labs & imaging results that were available during my care of the patient were reviewed by me and considered in my medical decision making (see chart for details).  85 y.o. male coming from home with concerns of cough, generalized weakness, and changes in mental status.  Overall, I am concerned about pneumonia given my pulmonary examination, as well as incompletely treated UTI.  The patient's laboratory studies are pending.  His chest x-ray does show bibasilar infiltrates that could be consistent with pneumonia and I have written him for Levaquin for community-acquired pneumonia.  He will also receive intravenous fluids.  The patient's UA shows bacteriuria without any other signs of UTI; a urine culture has been sent, and most urinary pathogens will be offered with the Levaquin.  ----------------------------------------- 11:24 PM on  12/05/2018 -----------------------------------------  The patient CT head shows a thin left-sided hygroma, which is new compared to prior scan in 2015.  I spoke with Dr. Marsa Aris, the neurosurgeon on-call, who feels that this hygroma is likely an evolution of a prior subdural, and is not acute.  He recommends follow-up with the PCP; there is no neurosurgical intervention.  At this time I will plan to admit the patient to the hospital here for continued evaluation and treatment.  CRITICAL CARE Performed by: Eula Listen   Total critical care time: 40 minutes  Critical care time was exclusive of separately billable procedures and treating other patients.  Critical care was necessary to treat or prevent imminent or life-threatening deterioration.  Critical care was time spent personally by me on the following activities: development of treatment plan with patient and/or surrogate as well as nursing, discussions with consultants, evaluation of patient's response to treatment,  examination of patient, obtaining history from patient or surrogate, ordering and performing treatments and interventions, ordering and review of laboratory studies, ordering and review of radiographic studies, pulse oximetry and re-evaluation of patient's condition.   ____________________________________________  FINAL CLINICAL IMPRESSION(S) / ED DIAGNOSES  Final diagnoses:  Pneumonia of both lower lobes due to infectious organism ([place])  Enlarged heart  Altered mental status, unspecified altered mental status type  Generalized weakness  Hygroma         NEW MEDICATIONS STARTED DURING THIS VISIT:  New Prescriptions   No medications on file      Eula Listen, MD 12/05/18 2332

## 2018-12-05 NOTE — ED Triage Notes (Signed)
Pt to triage via w/c with no distress noted; son reports confusion last 1-2wks; has [facility] nurse that comes out; seen PCP last wk and dx with UTI; completed unknown antibiotic but confusion persists with prod cough clear-yellow sputum, weakness

## 2018-12-06 ENCOUNTER — Encounter: Payer: Self-pay | Admitting: Physician Assistant

## 2018-12-06 ENCOUNTER — Inpatient Hospital Stay (HOSPITAL_COMMUNITY)
Admit: 2018-12-06 | Discharge: 2018-12-06 | Disposition: A | Discharge status: Home / Self Care | Payer: Medicare HMO | Attending: Hospitalist | Admitting: Hospitalist

## 2018-12-06 ENCOUNTER — Other Ambulatory Visit: Payer: Self-pay

## 2018-12-06 DIAGNOSIS — Z79899 Other long term (current) drug therapy: Secondary | ICD-10-CM | POA: Diagnosis not present

## 2018-12-06 DIAGNOSIS — I509 Heart failure, unspecified: Secondary | ICD-10-CM | POA: Diagnosis not present

## 2018-12-06 DIAGNOSIS — G309 Alzheimer's disease, unspecified: Secondary | ICD-10-CM | POA: Diagnosis present

## 2018-12-06 DIAGNOSIS — I248 Other forms of acute ischemic heart disease: Secondary | ICD-10-CM

## 2018-12-06 DIAGNOSIS — D638 Anemia in other chronic diseases classified elsewhere: Secondary | ICD-10-CM | POA: Diagnosis present

## 2018-12-06 DIAGNOSIS — D61818 Other pancytopenia: Secondary | ICD-10-CM | POA: Diagnosis present

## 2018-12-06 DIAGNOSIS — R4182 Altered mental status, unspecified: Secondary | ICD-10-CM | POA: Diagnosis present

## 2018-12-06 DIAGNOSIS — E871 Hypo-osmolality and hyponatremia: Secondary | ICD-10-CM | POA: Diagnosis present

## 2018-12-06 DIAGNOSIS — E875 Hyperkalemia: Secondary | ICD-10-CM | POA: Diagnosis present

## 2018-12-06 DIAGNOSIS — N179 Acute kidney failure, unspecified: Secondary | ICD-10-CM | POA: Diagnosis present

## 2018-12-06 DIAGNOSIS — R7989 Other specified abnormal findings of blood chemistry: Secondary | ICD-10-CM

## 2018-12-06 DIAGNOSIS — J189 Pneumonia, unspecified organism: Secondary | ICD-10-CM | POA: Diagnosis present

## 2018-12-06 DIAGNOSIS — D509 Iron deficiency anemia, unspecified: Secondary | ICD-10-CM | POA: Diagnosis present

## 2018-12-06 DIAGNOSIS — Z8701 Personal history of pneumonia (recurrent): Secondary | ICD-10-CM | POA: Diagnosis not present

## 2018-12-06 DIAGNOSIS — N390 Urinary tract infection, site not specified: Secondary | ICD-10-CM | POA: Diagnosis present

## 2018-12-06 DIAGNOSIS — Y95 Nosocomial condition: Secondary | ICD-10-CM | POA: Diagnosis present

## 2018-12-06 DIAGNOSIS — Z87891 Personal history of nicotine dependence: Secondary | ICD-10-CM | POA: Diagnosis not present

## 2018-12-06 DIAGNOSIS — I361 Nonrheumatic tricuspid (valve) insufficiency: Secondary | ICD-10-CM | POA: Diagnosis not present

## 2018-12-06 DIAGNOSIS — N184 Chronic kidney disease, stage 4 (severe): Secondary | ICD-10-CM | POA: Diagnosis present

## 2018-12-06 DIAGNOSIS — I5023 Acute on chronic systolic (congestive) heart failure: Secondary | ICD-10-CM | POA: Diagnosis present

## 2018-12-06 DIAGNOSIS — E872 Acidosis: Secondary | ICD-10-CM | POA: Diagnosis present

## 2018-12-06 DIAGNOSIS — R296 Repeated falls: Secondary | ICD-10-CM | POA: Diagnosis present

## 2018-12-06 DIAGNOSIS — G9341 Metabolic encephalopathy: Secondary | ICD-10-CM | POA: Diagnosis present

## 2018-12-06 DIAGNOSIS — I4821 Permanent atrial fibrillation: Secondary | ICD-10-CM | POA: Diagnosis not present

## 2018-12-06 DIAGNOSIS — F0280 Dementia in other diseases classified elsewhere without behavioral disturbance: Secondary | ICD-10-CM | POA: Diagnosis present

## 2018-12-06 DIAGNOSIS — I272 Pulmonary hypertension, unspecified: Secondary | ICD-10-CM | POA: Diagnosis present

## 2018-12-06 DIAGNOSIS — I4819 Other persistent atrial fibrillation: Secondary | ICD-10-CM

## 2018-12-06 DIAGNOSIS — I130 Hypertensive heart and chronic kidney disease with heart failure and stage 1 through stage 4 chronic kidney disease, or unspecified chronic kidney disease: Secondary | ICD-10-CM | POA: Diagnosis present

## 2018-12-06 DIAGNOSIS — Z8546 Personal history of malignant neoplasm of prostate: Secondary | ICD-10-CM | POA: Diagnosis not present

## 2018-12-06 DIAGNOSIS — I5022 Chronic systolic (congestive) heart failure: Secondary | ICD-10-CM | POA: Diagnosis not present

## 2018-12-06 DIAGNOSIS — Z85038 Personal history of other malignant neoplasm of large intestine: Secondary | ICD-10-CM | POA: Diagnosis not present

## 2018-12-06 LAB — GLUCOSE, CAPILLARY: GLUCOSE-CAPILLARY: 72 mg/dL (ref 70–99)

## 2018-12-06 LAB — COMPREHENSIVE METABOLIC PANEL
ALK PHOS: 60 U/L (ref 38–126)
ALT: 42 U/L (ref 0–44)
ALT: 33 U/L (ref 0–44)
AST: 119 U/L — ABNORMAL HIGH (ref 15–41)
AST: 110 U/L — ABNORMAL HIGH (ref 15–41)
Albumin: 3.7 g/dL (ref 3.5–5.0)
Albumin: 3 g/dL — ABNORMAL LOW (ref 3.5–5.0)
Alkaline Phosphatase: 51 U/L (ref 38–126)
Anion gap: 10 (ref 5–15)
Anion gap: 10 (ref 5–15)
BUN: 114 mg/dL — ABNORMAL HIGH (ref 8–23)
BUN: 101 mg/dL — ABNORMAL HIGH (ref 8–23)
CALCIUM: 9.6 mg/dL (ref 8.9–10.3)
CO2: 15 mmol/L — ABNORMAL LOW (ref 22–32)
CO2: 13 mmol/L — ABNORMAL LOW (ref 22–32)
Calcium: 9.3 mg/dL (ref 8.9–10.3)
Chloride: 95 mmol/L — ABNORMAL LOW (ref 98–111)
Chloride: 98 mmol/L (ref 98–111)
Creatinine, Ser: 3.37 mg/dL — ABNORMAL HIGH (ref 0.61–1.24)
Creatinine, Ser: 3.01 mg/dL — ABNORMAL HIGH (ref 0.61–1.24)
GFR calc Af Amer: 18 mL/min — ABNORMAL LOW (ref >60)
GFR calc Af Amer: 21 mL/min — ABNORMAL LOW (ref >60)
GFR calc non Af Amer: 16 mL/min — ABNORMAL LOW (ref >60)
GFR, EST NON AFRICAN AMERICAN: 18 mL/min — AB (ref >60)
Glucose, Bld: 76 mg/dL (ref 70–99)
Glucose, Bld: 73 mg/dL (ref 70–99)
Potassium: 5.2 mmol/L — ABNORMAL HIGH (ref 3.5–5.1)
Potassium: 5.4 mmol/L — ABNORMAL HIGH (ref 3.5–5.1)
Sodium: 120 mmol/L — ABNORMAL LOW (ref 135–145)
Sodium: 121 mmol/L — ABNORMAL LOW (ref 135–145)
Total Bilirubin: 0.5 mg/dL (ref 0.3–1.2)
Total Bilirubin: 0.8 mg/dL (ref 0.3–1.2)
Total Protein: 7.4 g/dL (ref 6.5–8.1)
Total Protein: 6.8 g/dL (ref 6.5–8.1)

## 2018-12-06 LAB — CBC
HCT: 26.9 % — ABNORMAL LOW (ref 39.0–52.0)
Hemoglobin: 9.8 g/dL — ABNORMAL LOW (ref 13.0–17.0)
MCH: 28.4 pg (ref 26.0–34.0)
MCHC: 36.4 g/dL — ABNORMAL HIGH (ref 30.0–36.0)
MCV: 78 fL — AB (ref 80.0–100.0)
PLATELETS: 124 10*3/uL — AB (ref 150–400)
RBC: 3.45 MIL/uL — ABNORMAL LOW (ref 4.22–5.81)
RDW: 17.2 % — ABNORMAL HIGH (ref 11.5–15.5)
WBC: 3.5 10*3/uL — ABNORMAL LOW (ref 4.0–10.5)
nRBC: 0.9 % — ABNORMAL HIGH (ref 0.0–0.2)

## 2018-12-06 LAB — PROTIME-INR
INR: 1.1 (ref 0.8–1.2)
PROTHROMBIN TIME: 14.1 s (ref 11.4–15.2)

## 2018-12-06 LAB — APTT: aPTT: 39 seconds — ABNORMAL HIGH (ref 24–36)

## 2018-12-06 LAB — INFLUENZA PANEL BY PCR (TYPE A & B)
Influenza A By PCR: NEGATIVE
Influenza B By PCR: NEGATIVE

## 2018-12-06 LAB — TROPONIN I
TROPONIN I: 0.22 ng/mL — AB (ref <0.03)
Troponin I: 0.24 ng/mL (ref <0.03)
Troponin I: 0.24 ng/mL (ref <0.03)
Troponin I: 0.22 ng/mL (ref <0.03)

## 2018-12-06 LAB — LACTIC ACID, PLASMA: Lactic Acid, Venous: 0.9 mmol/L (ref 0.5–1.9)

## 2018-12-06 MED ORDER — TAMSULOSIN HCL 0.4 MG PO CAPS
0.4000 mg | ORAL_CAPSULE | Freq: Every day | ORAL | Status: DC
  Administered 2018-12-07 – 2018-12-09 (×3): 0.4 mg via ORAL
  Filled 2018-12-06 (×5): qty 1

## 2018-12-06 MED ORDER — GUAIFENESIN ER 600 MG PO TB12
600.0000 mg | ORAL_TABLET | Freq: Two times a day (BID) | ORAL | Status: DC
  Administered 2018-12-07 – 2018-12-09 (×5): 600 mg via ORAL
  Filled 2018-12-06 (×8): qty 1

## 2018-12-06 MED ORDER — METOPROLOL TARTRATE 25 MG PO TABS: 25.0000 mg | ORAL_TABLET | Freq: Two times a day (BID) | ORAL | Status: DC

## 2018-12-06 MED ORDER — FUROSEMIDE 20 MG PO TABS
20.0000 mg | ORAL_TABLET | Freq: Every day | ORAL | Status: DC
  Filled 2018-12-06: qty 1

## 2018-12-06 MED ORDER — CALCITRIOL 0.25 MCG PO CAPS
0.5000 ug | ORAL_CAPSULE | Freq: Every day | ORAL | Status: DC
  Administered 2018-12-07 – 2018-12-09 (×3): 0.5 ug via ORAL
  Filled 2018-12-06 (×4): qty 2

## 2018-12-06 MED ORDER — ALBUTEROL SULFATE (2.5 MG/3ML) 0.083% IN NEBU: 2.5000 mg | INHALATION_SOLUTION | RESPIRATORY_TRACT | Status: DC | PRN

## 2018-12-06 MED ORDER — ATORVASTATIN CALCIUM 20 MG PO TABS
20.0000 mg | ORAL_TABLET | Freq: Every day | ORAL | Status: DC
  Administered 2018-12-07 – 2018-12-09 (×3): 20 mg via ORAL
  Filled 2018-12-06 (×3): qty 1

## 2018-12-06 MED ORDER — SODIUM CHLORIDE 0.9% FLUSH
3.0000 mL | Freq: Two times a day (BID) | INTRAVENOUS | Status: DC
  Administered 2018-12-06 – 2018-12-09 (×6): 3 mL via INTRAVENOUS

## 2018-12-06 MED ORDER — ACETAMINOPHEN 650 MG RE SUPP: 650.0000 mg | Freq: Four times a day (QID) | RECTAL | Status: DC | PRN

## 2018-12-06 MED ORDER — HEPARIN SODIUM (PORCINE) 5000 UNIT/ML IJ SOLN
5000.0000 [IU] | Freq: Two times a day (BID) | INTRAMUSCULAR | Status: DC
  Administered 2018-12-06 – 2018-12-08 (×5): 5000 [IU] via SUBCUTANEOUS
  Filled 2018-12-06 (×5): qty 1

## 2018-12-06 MED ORDER — ALLOPURINOL 300 MG PO TABS
300.0000 mg | ORAL_TABLET | Freq: Every day | ORAL | Status: DC
  Filled 2018-12-06: qty 1

## 2018-12-06 MED ORDER — LEVOFLOXACIN IN D5W 750 MG/150ML IV SOLN
750.0000 mg | INTRAVENOUS | Status: DC
  Filled 2018-12-06: qty 150

## 2018-12-06 MED ORDER — ONDANSETRON HCL 4 MG PO TABS: 4.0000 mg | ORAL_TABLET | Freq: Four times a day (QID) | ORAL | Status: DC | PRN

## 2018-12-06 MED ORDER — AMLODIPINE BESYLATE 5 MG PO TABS: 5.0000 mg | ORAL_TABLET | Freq: Every day | ORAL | Status: DC

## 2018-12-06 MED ORDER — ONDANSETRON HCL 4 MG/2ML IJ SOLN: 4.0000 mg | Freq: Four times a day (QID) | INTRAMUSCULAR | Status: DC | PRN

## 2018-12-06 MED ORDER — ACETAMINOPHEN 325 MG PO TABS: 650.0000 mg | ORAL_TABLET | Freq: Four times a day (QID) | ORAL | Status: DC | PRN

## 2018-12-06 MED ORDER — LEVOFLOXACIN IN D5W 750 MG/150ML IV SOLN: 750.0000 mg | INTRAVENOUS | Status: DC

## 2018-12-06 NOTE — ED Notes (Signed)
Trop sent

## 2018-12-06 NOTE — ED Notes (Signed)
Pt observed with wet brief x2. Pt cleaned and dried. No acute distress noted.

## 2018-12-06 NOTE — ED Notes (Signed)
Dale Williams (great great nephew)  [phone]

## 2018-12-06 NOTE — Consult Note (Signed)
Cardiology Consultation:   Patient IDGeorgio Williams; [id number]; 07/24/1931   Admit date: 12/05/2018 Date of Consult: 12/06/2018  Primary Care Provider: Juanell Fairly, MD Primary Cardiologist: new to [facility] - consult by End   Patient Profile:   Dale Williams is a 85 y.o. male with a hx of HFrEF, persistent A. fib not on oral anticoagulation, pulmonary hypertension, Alzheimer's dementia, colon cancer status post surgical resection, CKD stage III secondary to hypertension, anemia of chronic disease, frequent falls, and frequent UTIs with history of pneumonia who is being seen today for the evaluation of elevated troponin at the request of Dr. Manuella Ghazi.  History of Present Illness:   Mr. Dale Williams has known persistent A. fib which has been rate controlled and not been placed on oral anticoagulation secondary to the patient's significant comorbid conditions as outlined above.  He was admitted to [facility] in 09/2018 with altered mental status in the setting of right lower lobe pneumonia.  Echo during that admission showed an EF of 45 to 50% with moderate pulmonary hypertension and mild mitral regurgitation.  He was documented to be in A. fib with good rate control.  He was recently treated for UTI as an outpatient.  Notes indicate the patient was brought in by family secondary to worsening altered mental status from his baseline.  In the ED, he was found to have recurrent pneumonia and placed on antibiotics per internal medicine.  Troponin was checked and found to be minimally elevated at 0.24 and flat trending.  EKG showed A. fib with significant underlying baseline artifact making interpretation quite difficult.  Labs show acute on chronic kidney disease as well as hyperkalemia with a potassium of 5.2 trending to 5.4.  CBC showed pancytopenia.  Sodium low at 120.  CT head showed a thin left subdural hygroma measuring 5 mm primarily in the frontal region.  There was a 3 mm right midline shift.  No evidence of  hemorrhagic component.  This was age-indeterminate, however new from study in 05/2014.  Cardiology is asked to evaluate the patient's elevated troponin.   Past Medical History:  Diagnosis Date  . Alzheimer's dementia ([place])   . Chronic kidney disease (CKD), stage III (moderate) ([facility])   . Colon cancer ([place])   . HFrEF (heart failure with reduced ejection fraction) ([place])   . Hypertension   . Persistent atrial fibrillation    a. CHADS2VASc 5 (CHF, HTN, age 67, vascular disease); b. not on [place] 2/2 dementia and fall risk  . Prostate cancer [facility])     Past Surgical History:  Procedure Laterality Date  . COLON SURGERY    . HERNIA REPAIR       Home Meds: Prior to Admission medications   Medication Sig Start Date End Date Taking? Authorizing Provider  allopurinol (ZYLOPRIM) 300 MG tablet Take 300 mg by mouth daily.  05/17/17  Yes [provider]  amLODipine (NORVASC) 5 MG tablet Take 5 mg by mouth daily.   Yes [provider]  atorvastatin (LIPITOR) 20 MG tablet Take 20 mg by mouth daily at 6 PM.  05/17/17  Yes [provider]  calcitRIOL (ROCALTROL) 0.5 MCG capsule Take 0.5 mcg by mouth daily.  11/10/18  Yes [provider]  donepezil (ARICEPT) 10 MG tablet Take 10 mg by mouth at bedtime.  10/04/17  Yes [provider]  levofloxacin (LEVAQUIN) 500 MG tablet Take 1 tablet by mouth daily. 11/29/18  Yes [provider]  metoprolol tartrate (LOPRESSOR) 25 MG tablet  Take 25 mg by mouth 2 (two) times daily.  11/10/18 12/10/18 Yes [provider]  tamsulosin (FLOMAX) 0.4 MG CAPS capsule Take 0.4 mg by mouth daily.  05/17/17  Yes [provider]  furosemide (LASIX) 20 MG tablet Take by mouth. 11/10/18 12/10/18  [provider]    Inpatient Medications: Scheduled Meds: . allopurinol  300 mg Oral Daily  . amLODipine  5 mg Oral Daily  . atorvastatin  20 mg Oral q1800  . calcitRIOL  0.5 mcg Oral Daily  . furosemide  20 mg Oral Daily    . guaiFENesin  600 mg Oral BID  . heparin  5,000 Units Subcutaneous Q12H  . metoprolol tartrate  25 mg Oral BID  . sodium chloride flush  3 mL Intravenous Q12H  . tamsulosin  0.4 mg Oral Daily   Continuous Infusions: . [START ON 12/07/2018] levofloxacin (LEVAQUIN) IV     PRN Meds: acetaminophen **OR** acetaminophen, albuterol, ondansetron **OR** ondansetron (ZOFRAN) IV  Allergies:   Allergies  Allergen Reactions  . Penicillins Anaphylaxis    Social History:   Social History   Socioeconomic History  . Marital status: Single    Spouse name: Not on file  . Number of children: Not on file  . Years of education: Not on file  . Highest education level: Not on file  Occupational History  . Not on file  Social Needs  . Financial resource strain: Not on file  . Food insecurity:    Worry: Not on file    Inability: Not on file  . Transportation needs:    Medical: Not on file    Non-medical: Not on file  Tobacco Use  . Smoking status: Former Smoker    Types: Cigars  . Smokeless tobacco: Never Used  . Tobacco comment: 2 cigars daily  Substance and Sexual Activity  . Alcohol use: Not Currently  . Drug use: Never  . Sexual activity: Not on file  Lifestyle  . Physical activity:    Days per week: Not on file    Minutes per session: Not on file  . Stress: Not on file  Relationships  . Social connections:    Talks on phone: Not on file    Gets together: Not on file    Attends religious service: Not on file    Active member of club or organization: Not on file    Attends meetings of clubs or organizations: Not on file    Relationship status: Not on file  . Intimate partner violence:    Fear of current or ex partner: Not on file    Emotionally abused: Not on file    Physically abused: Not on file    Forced sexual activity: Not on file  Other Topics Concern  . Not on file  Social History Narrative  . Not on file     Family History:   Family History  Problem Relation  Age of Onset  . Bone cancer Mother   . Hypertension Father     ROS:  Review of Systems  Unable to perform ROS: Dementia      Physical Exam/Data:   Vitals:   12/06/18 0900 12/06/18 0930 12/06/18 1000 12/06/18 1100  BP: 106/62 (!) 111/59 100/76 105/71  Pulse: 86 (!) 57 78 69  Resp: 20 20 13 17   Temp:      TempSrc:      SpO2: 100% 95% 98% 97%  Weight:      Height:  No intake or output data in the 24 hours ending 12/06/18 1127 Filed Weights   12/05/18 2110  Weight: 71.2 kg   Body mass index is 19.62 kg/m.   Physical Exam: General: Elderly appearing, in no acute distress. Head: Normocephalic, atraumatic, sclera non-icteric, no xanthomas, nares without discharge.  Neck: Negative for carotid bruits. JVD elevated approximately 10 cm. Lungs: Diminished, coarse breath sounds bilaterally. Breathing is unlabored. Heart: Irregularly irregular with S1 S2. No murmurs, rubs, or gallops appreciated. Abdomen: Soft, non-tender, non-distended with normoactive bowel sounds. No hepatomegaly. No rebound/guarding. No obvious abdominal masses. Msk:  Strength and tone appear normal for age. Extremities: No clubbing or cyanosis. No edema. Distal pedal pulses are 2+ and equal bilaterally. Neuro: Alert to person only. No facial asymmetry. No focal deficit. Moves all extremities spontaneously. Psych:  Responds to questions appropriately with a normal affect.   EKG:  The EKG was personally reviewed and demonstrates: A. fib, 70 bpm, left axis deviation, LVH, significant baseline artifact making interpretation difficult.  Repeat EKG in the ED showed A. fib, LVH, nonspecific IVCD, nonspecific ST-T changes Telemetry:  Telemetry was personally reviewed and demonstrates: A. fib  Weights: Filed Weights   12/05/18 2110  Weight: 71.2 kg    Relevant CV Studies: 2D echo 08/12/2018: Technically difficult study due to chest wall/lung interference and  tachycardia/ectopy  Dilated left ventricle -  moderate  Mildly decreased left ventricular systolic function, ejection fraction  45 to 50%  Segmental wall motion abnormality - (basal inferior)  Mitral regurgitation - mild  Aortic sclerosis  Dilated left atrium - moderate  Low normal right ventricular systolic function  Dilated right ventricle - moderate  Tricuspid regurgitation - moderate  Elevated pulmonary artery systolic pressure - moderate  Dilated right atrium - moderate  Mildly elevated right atrial pressure  Laboratory Data:  Chemistry Recent Labs  Lab 12/05/18 2255 12/06/18 0615  NA 120* 121*  K 5.2* 5.4*  CL 95* 98  CO2 15* 13*  GLUCOSE 76 73  BUN 114* 101*  CREATININE 3.37* 3.01*  CALCIUM 9.6 9.3  GFRNONAA 16* 18*  GFRAA 18* 21*  ANIONGAP 10 10    Recent Labs  Lab 12/05/18 2255 12/06/18 0615  PROT 7.4 6.8  ALBUMIN 3.7 3.0*  AST 119* 110*  ALT 42 33  ALKPHOS 60 51  BILITOT 0.5 0.8   Hematology Recent Labs  Lab 12/05/18 2134 12/06/18 0615  WBC 3.7* 3.5*  RBC 3.72* 3.45*  HGB 10.7* 9.8*  HCT 29.2* 26.9*  MCV 78.5* 78.0*  MCH 28.8 28.4  MCHC 36.6* 36.4*  RDW 17.2* 17.2*  PLT 134* 124*   Cardiac Enzymes Recent Labs  Lab 12/05/18 2255 12/06/18 1033  TROPONINI 0.24* 0.24*   No results for input(s): TROPIPOC in the last 168 hours.  BNP Recent Labs  Lab 12/05/18 2134  BNP 1,473.0*    DDimer No results for input(s): DDIMER in the last 168 hours.  Radiology/Studies:  Ct Head Wo Contrast  Result Date: 12/05/2018 IMPRESSION: 1. Thin left subdural hygroma measures up to 5 mm primarily in the frontal region. There is 3 mm right midline shift. No evidence of hemorrhagic component. This is age indeterminate, but new from 05/17/2014 comparison. 2. Generalized atrophy and chronic small vessel ischemia. Electronically Signed   By: Keith Rake M.D.   On: 12/05/2018 22:59   Dg Chest Portable 1 View  Result Date: 12/05/2018 IMPRESSION: 1. Low lung volumes with progressive  cardiomegaly over the past 7 months. Globular configuration of  the cardiac silhouette can be seen with pericardial effusion. 2. Patchy bibasilar opacities, left greater than right. This may represent atelectasis, aspiration, or pneumonia. 3.  Aortic Atherosclerosis (ICD10-I70.0). Electronically Signed   By: Keith Rake M.D.   On: 12/05/2018 21:47    Assessment and Plan:   1.  Elevated troponin: -Of uncertain significance given there is no objective evidence of ACS -Initial EKG is not consistent with ST elevation MI -Minimally elevated with a current value of 0.242 likely in the setting of the patient's acute on chronic CKD stage III, anemia of chronic disease with a current hemoglobin of 9.8 with a baseline around 11, and suspected pneumonia -Patient is not a candidate for any invasive cardiac testing/intervention given his advanced age, dementia, chronic kidney disease, anemia, and frequent falls -Echocardiogram is reasonable -Would not place patient on heparin drip at this time given lack of objective evidence indicating this being ACS  2.  Acute on chronic HFrEF/pulmonary hypertension: -No prior ischemic evaluation -Prior EF known to be 45 to 50% in 08/2018 -Gentle diuresis is reasonable -Check echo -Would consider discontinuing amlodipine and placing the patient on Isordil/hydralazine for evidence-based therapy -Continue metoprolol -Daily weights/strict I's and O's  3.  Persistent A. Fib: -Ventricular rates are reasonably controlled -Continue rate control with Lopressor -Not on long-term, full dose oral anticoagulation in the setting of frequent falls with documented left shoulder hematoma on exam today, dementia, advanced kidney disease, and anemia -Without any family present to discuss this further, I would continue to recommend no full dose anticoagulation  4.  CAP: -Per IM  5.  Alzheimer's dementia: -Per IM  6.  Hyperkalemia: -No medications to discontinue -Recommend  close following by internal medicine  7.  Pancytopenia: -Work-up deferred to internal medicine  8.  Hyponatremia:  -Likely contributing to the patient's worsened altered state -Work-up per internal medicine   For questions or updates, please contact [place] Please consult [email or web address] for contact info under Cardiology/STEMI.   Signed, Christell Faith, PA-C [place] Pager: [phone] 12/06/2018, 11:27 AM

## 2018-12-06 NOTE — Progress Notes (Addendum)
Same day progress note Troponin elevated to 0.24, EKG reviewed shows ? STEMI but + artifact, repeat EKG ordered STAT. Serial troponin ordered.  ECHO ordered. [facility]/Dr. End consulted.  Nephrology consulted, hyperkalemia, renal failure. Text sent via Epic @ Dr. Juleen China. Patient seen in ED appears comfortable, no complaints but + confusion baseline dementia, poor historian. Attempted to call family member of contact on file, no answer will continue to try.  Ripley Fraise, PA

## 2018-12-06 NOTE — H&P (Signed)
[facility] [place] NAME: Dale [place]    MR#:  [id number]  DATE OF BIRTH:  [birth year]  DATE OF ADMISSION:  12/05/2018  PRIMARY CARE PHYSICIAN: Juanell Fairly, MD   REQUESTING/REFERRING PHYSICIAN: Dr. Mariea Clonts  CHIEF COMPLAINT:   Chief Complaint  Patient presents with  . Altered Mental Status    HISTORY OF PRESENT ILLNESS:  Dale [place]  is a 85 y.o. male with a known history listed below brought by family member for evaluation of generalized weakness, cough and concerns for UTI.  Family also noticed patient has change in mental status.  Patient has previous history of pneumonia and UTI.  Patient gets similar changes with infection.  Patient is not able to provide history secondary to dementia.  All the history is discussed with patient's nephew at bedside.  As per nephew patient is feeling weakness for last 1 to 2 weeks.  Patient was recently diagnosed with UTI and finished antibiotic treatment.  Patient is still weak and not feeling well.  Patient has cough with sometimes yellow sputum production.  Patient is not at his baseline mental status.  In emergency room patient underwent initial evaluation that suggestive of pneumonia.  CT head showed hygroma.  ER doctor has discussed case with neurosurgeon who recommended no further intervention or evaluation.  Hospitalist team requested for admission.  PAST MEDICAL HISTORY:   Past Medical History:  Diagnosis Date  . Alzheimer's dementia ([place])   . CHF (congestive heart failure) ([place])   . Colon cancer ([place])   . Hypertension   . Irregular heartbeat   . Prostate cancer ([place])   . Renal disease     PAST SURGICAL HISTORY:   Past Surgical History:  Procedure Laterality Date  . COLON SURGERY    . HERNIA REPAIR      SOCIAL HISTORY:   Social History   Tobacco Use  . Smoking status: Former Smoker    Types: Cigars  . Smokeless tobacco: Never Used  . Tobacco comment: 2 cigars daily    Substance Use Topics  . Alcohol use: Not Currently    FAMILY HISTORY:  No family history on file.  DRUG ALLERGIES:   Allergies  Allergen Reactions  . Penicillins Anaphylaxis    REVIEW OF SYSTEMS:   ROS -Limited secondary to dementia.  Positive as per HPI otherwise negative  MEDICATIONS AT HOME:   Prior to Admission medications   Medication Sig Start Date End Date Taking? Authorizing Provider  allopurinol (ZYLOPRIM) 300 MG tablet Take 300 mg by mouth daily.  05/17/17  Yes [provider]  amLODipine (NORVASC) 5 MG tablet Take 5 mg by mouth daily.   Yes [provider]  atorvastatin (LIPITOR) 20 MG tablet Take 20 mg by mouth daily at 6 PM.  05/17/17  Yes [provider]  calcitRIOL (ROCALTROL) 0.5 MCG capsule Take 0.5 mcg by mouth daily.  11/10/18  Yes [provider]  donepezil (ARICEPT) 10 MG tablet Take 10 mg by mouth at bedtime.  10/04/17  Yes [provider]  levofloxacin (LEVAQUIN) 500 MG tablet Take 1 tablet by mouth daily. 11/29/18  Yes [provider]  metoprolol tartrate (LOPRESSOR) 25 MG tablet Take 25 mg by mouth 2 (two) times daily.  11/10/18 12/10/18 Yes [provider]  tamsulosin (FLOMAX) 0.4 MG CAPS capsule Take 0.4 mg by mouth daily.  05/17/17  Yes [provider]  furosemide (LASIX) 20 MG tablet Take by mouth. 11/10/18 12/10/18  [provider]      VITAL SIGNS:  Blood pressure (!) 126/112, pulse (!) 51, temperature 98 F (36.7 C), temperature source Oral, resp. rate 16, height 6\' 3"  (1.905 m), weight 71.2 kg, SpO2 99 %.  PHYSICAL EXAMINATION:  Physical Exam  GENERAL:  85 y.o.-year-old patient lying in the bed with no acute distress.  EYES: Pupils equal, round, reactive to light and accommodation. No scleral icterus. Extraocular muscles intact.  HEENT: Head atraumatic, normocephalic. Oropharynx and nasopharynx clear.  NECK:  Supple, no jugular venous distention. No thyroid enlargement, no  tenderness.  LUNGS: Bilateral coarse breath sounds CARDIOVASCULAR: S1, S2 normal. No murmurs, rubs, or gallops.  ABDOMEN: Soft, nontender, nondistended. Bowel sounds present. No organomegaly or mass.  EXTREMITIES: upper extremity scratch marks noted.  NEUROLOGIC: non-focal PSYCHIATRIC: Awake SKIN: warm, as above  LABORATORY PANEL:   CBC Recent Labs  Lab 12/05/18 2134  WBC 3.7*  HGB 10.7*  HCT 29.2*  PLT 134*   ------------------------------------------------------------------------------------------------------------------  Chemistries  No results for input(s): NA, K, CL, CO2, GLUCOSE, BUN, CREATININE, CALCIUM, MG, AST, ALT, ALKPHOS, BILITOT in the last 168 hours.  Invalid input(s): GFRCGP ------------------------------------------------------------------------------------------------------------------  Cardiac Enzymes No results for input(s): TROPONINI in the last 168 hours. ------------------------------------------------------------------------------------------------------------------  RADIOLOGY:  Ct Head Wo Contrast  Result Date: 12/05/2018 CLINICAL DATA:  Subacute neurologic deficit. Confusion. EXAM: CT HEAD WITHOUT CONTRAST TECHNIQUE: Contiguous axial images were obtained from the base of the skull through the vertex without intravenous contrast. COMPARISON:  Head CT 05/17/2014 FINDINGS: Brain: Thin left subdural hygroma measures up to 5 mm primarily in the frontal region. No evidence of hemorrhagic component. There is approximately 3 mm left right midline shift. No cyst new from prior exam. No hemorrhage or evidence of acute ischemia. Generalized atrophy with mild chronic small vessel ischemia. No hydrocephalus. Vascular: Atherosclerosis of skullbase vasculature without hyperdense vessel or abnormal calcification. Skull: No fracture or focal lesion. Sinuses/Orbits: Paranasal sinuses and mastoid air cells are clear. The visualized orbits are unremarkable. Other: None.  IMPRESSION: 1. Thin left subdural hygroma measures up to 5 mm primarily in the frontal region. There is 3 mm right midline shift. No evidence of hemorrhagic component. This is age indeterminate, but new from 05/17/2014 comparison. 2. Generalized atrophy and chronic small vessel ischemia. Electronically Signed   By: Keith Rake M.D.   On: 12/05/2018 22:59   Dg Chest Portable 1 View  Result Date: 12/05/2018 CLINICAL DATA:  Cough. Confusion. Shortness of breath. EXAM: PORTABLE CHEST 1 VIEW COMPARISON:  Radiographs 06/04/2018 FINDINGS: Lower lung volumes from prior exam. Progressive cardiomegaly with slight globular configuration. Aortic atherosclerosis. Patchy bibasilar opacities. No large pleural effusion. No pneumothorax. No acute osseous abnormalities. IMPRESSION: 1. Low lung volumes with progressive cardiomegaly over the past 7 months. Globular configuration of the cardiac silhouette can be seen with pericardial effusion. 2. Patchy bibasilar opacities, left greater than right. This may represent atelectasis, aspiration, or pneumonia. 3.  Aortic Atherosclerosis (ICD10-I70.0). Electronically Signed   By: Keith Rake M.D.   On: 12/05/2018 21:47      IMPRESSION AND PLAN:   1.  Community-acquired pneumonia: Patient started on IV Levaquin.  Symptomatic treatment.  Follow-up culture obtained in the emergency room.  Supportive care  2.  Altered mental status: Likely related to infection.  Supportive care.  Fall precautions.  Aspiration precautions.  Abnormal CT scan discussed with neurosurgery by ER physician.  No further intervention recommended.  3.  Recent UTI: Continue Levaquin.    4.  Generalized  weakness: Physical therapy and speech therapy evaluation.  5.  Chronic other medical problems: Monitor.  Continue home medication as ordered  DVT prophylaxis: Subcutaneous heparin  Moderate to high risk secondary to above  Further treatment based on clinical course  Estimated length of stay  more than 2 midnights   All the records are reviewed and case discussed with ED provider. Management plans discussed with the patient, family and they are in agreement.  CODE STATUS: Full  TOTAL TIME TAKING CARE OF THIS PATIENT: 36 minutes.    Sedalia Muta M.D on 12/06/2018 at 12:18 AM  Between 7am to 6pm - Pager - [phone]  After 6pm go to [email or web address] - [employer] [facility]  Office  [phone]  CC: Primary care physician; Juanell Fairly, MD

## 2018-12-06 NOTE — ED Notes (Signed)
Echo at bedside

## 2018-12-06 NOTE — ED Notes (Signed)
ED TO INPATIENT HANDOFF REPORT  ED Nurse Name and Phone #:  Dale [place] [phone]  S Name/Age/Gender Dale [place] 85 y.o. male Room/Bed: ED09A/ED09A  Code Status   Code Status: Full Code  Home/SNF/Other Home Patient oriented to: self Is this baseline? No   Triage Complete: Triage complete  Chief Complaint SOB/AMS  Triage Note Pt to triage via w/c with no distress noted; son reports confusion last 1-2wks; has [name] that comes out; seen PCP last wk and dx with UTI; completed unknown antibiotic but confusion persists with prod cough clear-yellow sputum, weakness   Allergies Allergies  Allergen Reactions  . Penicillins Anaphylaxis    Level of Care/Admitting Diagnosis ED Disposition    ED Disposition Condition [facility]: [place] [100120]  Level of Care: Telemetry [5]  Diagnosis: PNA (pneumonia) [517616]  Admitting Physician: Sedalia Muta [[id number]]  Attending Physician: Sedalia Muta [[id number]]  Estimated length of stay: 3 - 4 days  Certification:: I certify this patient will need inpatient services for at least 2 midnights  PT Class (Do Not Modify): Inpatient [101]  PT Acc Code (Do Not Modify): Private [1]       B Medical/Surgery History Past Medical History:  Diagnosis Date  . Alzheimer's dementia ([place])   . Chronic kidney disease (CKD), stage III (moderate) ([facility])   . Colon cancer ([place])   . HFrEF (heart failure with reduced ejection fraction) ([place])   . Hypertension   . Persistent atrial fibrillation    a. CHADS2VASc 5 (CHF, HTN, age 29, vascular disease); b. not on [place] 2/2 dementia and fall risk  . Prostate cancer [facility])    Past Surgical History:  Procedure Laterality Date  . COLON SURGERY    . HERNIA REPAIR       A IV Location/Drains/Wounds Patient Lines/Drains/Airways Status   Active Line/Drains/Airways    Name:   Placement date:   Placement time:   Site:   Days:   Peripheral IV 12/05/18 Right  Antecubital   12/05/18    2143    Antecubital   1   Peripheral IV 12/05/18 Left Antecubital   12/05/18    2312    Antecubital   1          Intake/Output Last 24 hours No intake or output data in the 24 hours ending 12/06/18 1751  Labs/Imaging Results for orders placed or performed during the hospital encounter of 12/05/18 (from the past 48 hour(s))  CBC with Differential     Status: Abnormal   Collection Time: 12/05/18  9:34 PM  Result Value Ref Range   WBC 3.7 (L) 4.0 - 10.5 K/uL   RBC 3.72 (L) 4.22 - 5.81 MIL/uL   Hemoglobin 10.7 (L) 13.0 - 17.0 g/dL   HCT 29.2 (L) 39.0 - 52.0 %   MCV 78.5 (L) 80.0 - 100.0 fL   MCH 28.8 26.0 - 34.0 pg   MCHC 36.6 (H) 30.0 - 36.0 g/dL   RDW 17.2 (H) 11.5 - 15.5 %   Platelets 134 (L) 150 - 400 K/uL   nRBC 1.1 (H) 0.0 - 0.2 %   Neutrophils Relative % 71 %   Neutro Abs 2.6 1.7 - 7.7 K/uL   Lymphocytes Relative 17 %   Lymphs Abs 0.6 (L) 0.7 - 4.0 K/uL   Monocytes Relative 11 %   Monocytes Absolute 0.4 0.1 - 1.0 K/uL   Eosinophils Relative 1 %   Eosinophils Absolute 0.0 0.0 - 0.5 K/uL  Basophils Relative 0 %   Basophils Absolute 0.0 0.0 - 0.1 K/uL   Immature Granulocytes 0 %   Abs Immature Granulocytes 0.01 0.00 - 0.07 K/uL    Comment: Performed at [facility], [address] [place], [place] [address]  Urinalysis, Complete w Microscopic     Status: Abnormal   Collection Time: 12/05/18  9:34 PM  Result Value Ref Range   Color, Urine YELLOW (A) YELLOW   APPearance HAZY (A) CLEAR   Specific Gravity, Urine 1.011 1.005 - 1.030   pH 5.0 5.0 - 8.0   Glucose, UA NEGATIVE NEGATIVE mg/dL   Hgb urine dipstick NEGATIVE NEGATIVE   Bilirubin Urine NEGATIVE NEGATIVE   Ketones, ur NEGATIVE NEGATIVE mg/dL   Protein, ur NEGATIVE NEGATIVE mg/dL   Nitrite NEGATIVE NEGATIVE   Leukocytes,Ua NEGATIVE NEGATIVE   WBC, UA 0-5 0 - 5 WBC/hpf   Bacteria, UA RARE (A) NONE SEEN   Squamous Epithelial / LPF NONE SEEN 0 - 5   Mucus PRESENT     Amorphous Crystal PRESENT     Comment: Performed at [facility], [address], [place], [place] [address]  Brain natriuretic peptide     Status: Abnormal   Collection Time: 12/05/18  9:34 PM  Result Value Ref Range   B Natriuretic Peptide 1,473.0 (H) 0.0 - 100.0 pg/mL    Comment: Performed at [facility], [address] [place], [place] [address]  Blood culture (routine x 2)     Status: None (Preliminary result)   Collection Time: 12/05/18 10:55 PM  Result Value Ref Range   Specimen Description BLOOD LEFT ANTECUBITAL    Special Requests      BOTTLES DRAWN AEROBIC AND ANAEROBIC Blood Culture adequate volume   Culture      NO GROWTH < 12 HOURS Performed at [facility], [address] [place], [place] [address]    Report Status PENDING   Blood culture (routine x 2)     Status: None (Preliminary result)   Collection Time: 12/05/18 10:55 PM  Result Value Ref Range   Specimen Description BLOOD BLOOD LEFT FOREARM    Special Requests      BOTTLES DRAWN AEROBIC AND ANAEROBIC Blood Culture adequate volume   Culture      NO GROWTH < 12 HOURS Performed at [facility], [address] [place], [place] [address]    Report Status PENDING   Influenza panel by PCR (type A & B)     Status: None   Collection Time: 12/05/18 10:55 PM  Result Value Ref Range   Influenza A By PCR NEGATIVE NEGATIVE   Influenza B By PCR NEGATIVE NEGATIVE    Comment: (NOTE) The Xpert Xpress Flu assay is intended as an aid in the diagnosis of  influenza and should not be used as a sole basis for treatment.  This  assay is FDA approved for nasopharyngeal swab specimens only. Nasal  washings and aspirates are unacceptable for Xpert Xpress Flu testing. Performed at [facility], [address]  [place], [place] [address]   Comprehensive metabolic panel     Status: Abnormal   Collection Time: 12/05/18 10:55 PM  Result Value Ref Range   Sodium 120 (L) 135 - 145  mmol/L   Potassium 5.2 (H) 3.5 - 5.1 mmol/L   Chloride 95 (L) 98 - 111 mmol/L   CO2 15 (L) 22 - 32 mmol/L   Glucose, Bld 76 70 - 99 mg/dL   BUN  114 (H) 8 - 23 mg/dL    Comment: RESULT CONFIRMED BY MANUAL DILUTION SMA   Creatinine, Ser 3.37 (H) 0.61 - 1.24 mg/dL   Calcium 9.6 8.9 - 10.3 mg/dL   Total Protein 7.4 6.5 - 8.1 g/dL   Albumin 3.7 3.5 - 5.0 g/dL   AST 119 (H) 15 - 41 U/L   ALT 42 0 - 44 U/L   Alkaline Phosphatase 60 38 - 126 U/L   Total Bilirubin 0.5 0.3 - 1.2 mg/dL   GFR calc non Af Amer 16 (L) >60 mL/min   GFR calc Af Amer 18 (L) >60 mL/min   Anion gap 10 5 - 15    Comment: Performed at [facility], [address] [place], [place] [address]  Troponin I -     Status: Abnormal   Collection Time: 12/05/18 10:55 PM  Result Value Ref Range   Troponin I 0.24 (HH) <0.03 ng/mL    Comment: CRITICAL RESULT CALLED TO, READ BACK BY AND VERIFIED WITH KAYLA KEEN AT 0111 12/06/2018 SMA Performed at [facility], [address] [place], [place] [address]   Lactic acid, plasma     Status: None   Collection Time: 12/06/18 12:11 AM  Result Value Ref Range   Lactic Acid, Venous 0.9 0.5 - 1.9 mmol/L    Comment: Performed at [facility], [address] [place], [place] [address]  CBC     Status: Abnormal   Collection Time: 12/06/18  6:15 AM  Result Value Ref Range   WBC 3.5 (L) 4.0 - 10.5 K/uL   RBC 3.45 (L) 4.22 - 5.81 MIL/uL   Hemoglobin 9.8 (L) 13.0 - 17.0 g/dL   HCT 26.9 (L) 39.0 - 52.0 %   MCV 78.0 (L) 80.0 - 100.0 fL   MCH 28.4 26.0 - 34.0 pg   MCHC 36.4 (H) 30.0 - 36.0 g/dL   RDW 17.2 (H) 11.5 - 15.5 %   Platelets 124 (L) 150 - 400 K/uL    Comment: Immature Platelet Fraction may be clinically indicated, consider ordering this additional test [id number]    nRBC 0.9 (H) 0.0 - 0.2 %    Comment: Performed at [facility], [address] [place], [place] [address]  Comprehensive metabolic panel     Status: Abnormal   Collection  Time: 12/06/18  6:15 AM  Result Value Ref Range   Sodium 121 (L) 135 - 145 mmol/L   Potassium 5.4 (H) 3.5 - 5.1 mmol/L    Comment: HEMOLYSIS AT THIS LEVEL MAY AFFECT RESULT   Chloride 98 98 - 111 mmol/L   CO2 13 (L) 22 - 32 mmol/L   Glucose, Bld 73 70 - 99 mg/dL   BUN 101 (H) 8 - 23 mg/dL    Comment: RESULT CONFIRMED BY MANUAL DILUTION /HKP   Creatinine, Ser 3.01 (H) 0.61 - 1.24 mg/dL   Calcium 9.3 8.9 - 10.3 mg/dL   Total Protein 6.8 6.5 - 8.1 g/dL   Albumin 3.0 (L) 3.5 - 5.0 g/dL   AST 110 (H) 15 - 41 U/L   ALT 33 0 - 44 U/L   Alkaline Phosphatase 51 38 - 126 U/L   Total Bilirubin 0.8 0.3 - 1.2 mg/dL   GFR calc non Af Amer 18 (L) >60 mL/min   GFR calc Af Amer 21 (L) >60 mL/min   Anion gap 10 5 - 15    Comment: Performed at [facility], [address] [place], [place] [address]  Protime-INR     Status: None   Collection Time: 12/06/18  6:15 AM  Result Value Ref Range   Prothrombin Time 14.1 11.4 - 15.2 seconds   INR 1.1 0.8 - 1.2    Comment: (NOTE) INR goal varies based on device and disease states. Performed at [facility], [address] [place], [place] [address]   APTT     Status: Abnormal   Collection Time: 12/06/18  6:15 AM  Result Value Ref Range   aPTT 39 (H) 24 - 36 seconds    Comment:        IF BASELINE aPTT IS ELEVATED, SUGGEST PATIENT RISK ASSESSMENT BE USED TO DETERMINE APPROPRIATE ANTICOAGULANT THERAPY. Performed at [facility], [address] [place], [place] [address]   Glucose, capillary     Status: None   Collection Time: 12/06/18 10:24 AM  Result Value Ref Range   Glucose-Capillary 72 70 - 99 mg/dL  Troponin I - Now Then Q6H     Status: Abnormal   Collection Time: 12/06/18 10:33 AM  Result Value Ref Range   Troponin I 0.24 (HH) <0.03 ng/mL    Comment: CRITICAL VALUE NOTED. VALUE IS CONSISTENT WITH PREVIOUSLY REPORTED/CALLED VALUE/HKP Performed at [facility], [place], [place]  [address]   Troponin I - Now Then Q6H     Status: Abnormal   Collection Time: 12/06/18  3:11 PM  Result Value Ref Range   Troponin I 0.22 (HH) <0.03 ng/mL    Comment: CRITICAL VALUE NOTED. VALUE IS CONSISTENT WITH PREVIOUSLY REPORTED/CALLED VALUE/JUW Performed at [facility], [place],  [address]    Ct Head Wo Contrast  Result Date: 12/05/2018 CLINICAL DATA:  Subacute neurologic deficit. Confusion. EXAM: CT HEAD WITHOUT CONTRAST TECHNIQUE: Contiguous axial images were obtained from the base of the skull through the vertex without intravenous contrast. COMPARISON:  Head CT 05/17/2014 FINDINGS: Brain: Thin left subdural hygroma measures up to 5 mm primarily in the frontal region. No evidence of hemorrhagic component. There is approximately 3 mm left right midline shift. No cyst new from prior exam. No hemorrhage or evidence of acute ischemia. Generalized atrophy with mild chronic small vessel ischemia. No hydrocephalus. Vascular: Atherosclerosis of skullbase vasculature without hyperdense vessel or abnormal calcification. Skull: No fracture or focal lesion. Sinuses/Orbits: Paranasal sinuses and mastoid air cells are clear. The visualized orbits are unremarkable. Other: None. IMPRESSION: 1. Thin left subdural hygroma measures up to 5 mm primarily in the frontal region. There is 3 mm right midline shift. No evidence of hemorrhagic component. This is age indeterminate, but new from 05/17/2014 comparison. 2. Generalized atrophy and chronic small vessel ischemia. Electronically Signed   By: Keith Rake M.D.   On: 12/05/2018 22:59   Dg Chest Portable 1 View  Result Date: 12/05/2018 CLINICAL DATA:  Cough. Confusion. Shortness of breath. EXAM: PORTABLE CHEST 1 VIEW COMPARISON:  Radiographs 06/04/2018 FINDINGS: Lower lung volumes from prior exam. Progressive cardiomegaly with slight globular configuration. Aortic atherosclerosis. Patchy bibasilar opacities. No large pleural  effusion. No pneumothorax. No acute osseous abnormalities. IMPRESSION: 1. Low lung volumes with progressive cardiomegaly over the past 7 months. Globular configuration of the cardiac silhouette can be seen with pericardial effusion. 2. Patchy bibasilar opacities, left greater than right. This may represent atelectasis, aspiration, or pneumonia. 3.  Aortic Atherosclerosis (ICD10-I70.0). Electronically Signed   By: Keith Rake M.D.   On: 12/05/2018 21:47    Pending Labs Unresulted Labs (From admission, onward)  Start     Ordered   12/06/18 0912  Troponin I - Now Then Q6H  Now then every 6 hours,   STAT     12/06/18 0911   12/05/18 2222  Urine culture  ONCE - STAT,   STAT     12/05/18 2222          Vitals/Pain Today's Vitals   12/06/18 1346 12/06/18 1400 12/06/18 1500 12/06/18 1700  BP:  107/64 109/64 (!) 102/58  Pulse:   66   Resp:  14 18 15   Temp:      TempSrc:      SpO2:  95% 100% 98%  Weight:      Height:      PainSc: 0-No pain   Asleep    Isolation Precautions No active isolations  Medications Medications  amLODipine (NORVASC) tablet 5 mg (0 mg Oral Hold 12/06/18 1009)  atorvastatin (LIPITOR) tablet 20 mg (has no administration in time range)  metoprolol tartrate (LOPRESSOR) tablet 25 mg (0 mg Oral Hold 12/06/18 1010)  furosemide (LASIX) tablet 20 mg (0 mg Oral Hold 12/06/18 1010)  tamsulosin (FLOMAX) capsule 0.4 mg (0 mg Oral Hold 12/06/18 1011)  calcitRIOL (ROCALTROL) capsule 0.5 mcg (0 mcg Oral Hold 12/06/18 1010)  acetaminophen (TYLENOL) tablet 650 mg (has no administration in time range)    Or  acetaminophen (TYLENOL) suppository 650 mg (has no administration in time range)  ondansetron (ZOFRAN) tablet 4 mg (has no administration in time range)    Or  ondansetron (ZOFRAN) injection 4 mg (has no administration in time range)  heparin injection 5,000 Units (5,000 Units Subcutaneous Given 12/06/18 1025)  sodium chloride flush (NS) 0.9 % injection 3 mL (3 mLs Intravenous  Given 12/06/18 1025)  albuterol (PROVENTIL) (2.5 MG/3ML) 0.083% nebulizer solution 2.5 mg (has no administration in time range)  guaiFENesin (MUCINEX) 12 hr tablet 600 mg (0 mg Oral Hold 12/06/18 1010)  levofloxacin (LEVAQUIN) IVPB 750 mg (has no administration in time range)  levofloxacin (LEVAQUIN) IVPB 750 mg (0 mg Intravenous Stopped 12/06/18 0048)  sodium chloride 0.9 % bolus 1,000 mL (0 mLs Intravenous Stopped 12/06/18 0048)  sodium chloride 0.9 % bolus 500 mL (0 mLs Intravenous Stopped 12/06/18 0048)    Mobility walks with device High fall risk   Focused Assessments Neuro Assessment Handoff:  Swallow screen pass? No  Cardiac Rhythm: Normal sinus rhythm       Neuro Assessment: Exceptions to WDL Neuro Checks:      Last Documented NIHSS Modified Score:   Has TPA been given? No If patient is a Neuro Trauma and patient is going to OR before floor call report to [facility] nurse: [phone] or [phone]     R Recommendations: See Admitting Provider Note  Report given to:   Additional Notes:  Pt is oriented to self, he has baseline of Alzheimer's according to family he is typically more oriented but has been altered x 2 weeks. Recent UTI completed abx with ongoing confusion.

## 2018-12-06 NOTE — ED Notes (Signed)
Dale [place] from [facility] made aware pt needs speech evaluation.

## 2018-12-06 NOTE — ED Notes (Signed)
Pt observed holding saliva in mouth with some drooling onto right side of his face and chest. Pt provided emesis bag and encouraged to spit out saliva and pt complied. MD made aware.

## 2018-12-06 NOTE — ED Notes (Signed)
Cardiology at bedside.

## 2018-12-06 NOTE — ED Notes (Signed)
ED TO INPATIENT HANDOFF REPORT  ED Nurse Name and Phone #: Ophelia Charter Viviene Thurston [phone]  S Name/Age/Gender Dale Williams 85 y.o. male Room/Bed: ED09A/ED09A  Code Status   Code Status: Not on file  Home/SNF/Other home {Patient oriented to: self Is this baseline? yes  Triage Complete: Triage complete  Chief Complaint SOB/AMS  Triage Note Pt to triage via w/c with no distress noted; son reports confusion last 1-2wks; has [place] nurse that comes out; seen PCP last wk and dx with UTI; completed unknown antibiotic but confusion persists with prod cough clear-yellow sputum, weakness   Allergies Allergies  Allergen Reactions  . Penicillins Anaphylaxis    Level of Care/Admitting Diagnosis ED Disposition    ED Disposition Condition [facility] Area: [place] [[id number]]  Level of Care: Telemetry [5]  Diagnosis: PNA (pneumonia) [144315]  Admitting Physician: Sedalia Muta [[id number]]  Attending Physician: Sedalia Muta [[id number]]  Estimated length of stay: 3 - 4 days  Certification:: I certify this patient will need inpatient services for at least 2 midnights  PT Class (Do Not Modify): Inpatient [101]  PT Acc Code (Do Not Modify): Private [1]       B Medical/Surgery History Past Medical History:  Diagnosis Date  . Alzheimer's dementia ([place])   . CHF (congestive heart failure) ([place])   . Colon cancer ([place])   . Hypertension   . Irregular heartbeat   . Prostate cancer ([place])   . Renal disease    Past Surgical History:  Procedure Laterality Date  . COLON SURGERY    . HERNIA REPAIR       A IV Location/Drains/Wounds Patient Lines/Drains/Airways Status   Active Line/Drains/Airways    Name:   Placement date:   Placement time:   Site:   Days:   Peripheral IV 12/05/18 Right Antecubital   12/05/18    2143    Antecubital   1   Peripheral IV 12/05/18 Left Antecubital   12/05/18    2312    Antecubital   1          Intake/Output Last 24 hours No  intake or output data in the 24 hours ending 12/06/18 0049  Labs/Imaging Results for orders placed or performed during the hospital encounter of 12/05/18 (from the past 48 hour(s))  CBC with Differential     Status: Abnormal   Collection Time: 12/05/18  9:34 PM  Result Value Ref Range   WBC 3.7 (L) 4.0 - 10.5 K/uL   RBC 3.72 (L) 4.22 - 5.81 MIL/uL   Hemoglobin 10.7 (L) 13.0 - 17.0 g/dL   HCT 29.2 (L) 39.0 - 52.0 %   MCV 78.5 (L) 80.0 - 100.0 fL   MCH 28.8 26.0 - 34.0 pg   MCHC 36.6 (H) 30.0 - 36.0 g/dL   RDW 17.2 (H) 11.5 - 15.5 %   Platelets 134 (L) 150 - 400 K/uL   nRBC 1.1 (H) 0.0 - 0.2 %   Neutrophils Relative % 71 %   Neutro Abs 2.6 1.7 - 7.7 K/uL   Lymphocytes Relative 17 %   Lymphs Abs 0.6 (L) 0.7 - 4.0 K/uL   Monocytes Relative 11 %   Monocytes Absolute 0.4 0.1 - 1.0 K/uL   Eosinophils Relative 1 %   Eosinophils Absolute 0.0 0.0 - 0.5 K/uL   Basophils Relative 0 %   Basophils Absolute 0.0 0.0 - 0.1 K/uL   Immature Granulocytes 0 %   Abs Immature Granulocytes 0.01 0.00 - 0.07 K/uL  Comment: Performed at [facility], [address] [place], [place] [address]  Urinalysis, Complete w Microscopic     Status: Abnormal   Collection Time: 12/05/18  9:34 PM  Result Value Ref Range   Color, Urine YELLOW (A) YELLOW   APPearance HAZY (A) CLEAR   Specific Gravity, Urine 1.011 1.005 - 1.030   pH 5.0 5.0 - 8.0   Glucose, UA NEGATIVE NEGATIVE mg/dL   Hgb urine dipstick NEGATIVE NEGATIVE   Bilirubin Urine NEGATIVE NEGATIVE   Ketones, ur NEGATIVE NEGATIVE mg/dL   Protein, ur NEGATIVE NEGATIVE mg/dL   Nitrite NEGATIVE NEGATIVE   Leukocytes,Ua NEGATIVE NEGATIVE   WBC, UA 0-5 0 - 5 WBC/hpf   Bacteria, UA RARE (A) NONE SEEN   Squamous Epithelial / LPF NONE SEEN 0 - 5   Mucus PRESENT    Amorphous Crystal PRESENT     Comment: Performed at [facility], [address] [place], [place] [address]  Brain natriuretic peptide     Status: Abnormal   Collection  Time: 12/05/18  9:34 PM  Result Value Ref Range   B Natriuretic Peptide 1,473.0 (H) 0.0 - 100.0 pg/mL    Comment: Performed at [facility], [address] [place], [place] [address]  Influenza panel by PCR (type A & B)     Status: None   Collection Time: 12/05/18 10:55 PM  Result Value Ref Range   Influenza A By PCR NEGATIVE NEGATIVE   Influenza B By PCR NEGATIVE NEGATIVE    Comment: (NOTE) The Xpert Xpress Flu assay is intended as an aid in the diagnosis of  influenza and should not be used as a sole basis for treatment.  This  assay is FDA approved for nasopharyngeal swab specimens only. Nasal  washings and aspirates are unacceptable for Xpert Xpress Flu testing. Performed at [facility], [address] [place], [place] [address]   Lactic acid, plasma     Status: None   Collection Time: 12/06/18 12:11 AM  Result Value Ref Range   Lactic Acid, Venous 0.9 0.5 - 1.9 mmol/L    Comment: Performed at [facility], [address] [place], [place] [address]   Ct Head Wo Contrast  Result Date: 12/05/2018 CLINICAL DATA:  Subacute neurologic deficit. Confusion. EXAM: CT HEAD WITHOUT CONTRAST TECHNIQUE: Contiguous axial images were obtained from the base of the skull through the vertex without intravenous contrast. COMPARISON:  Head CT 05/17/2014 FINDINGS: Brain: Thin left subdural hygroma measures up to 5 mm primarily in the frontal region. No evidence of hemorrhagic component. There is approximately 3 mm left right midline shift. No cyst new from prior exam. No hemorrhage or evidence of acute ischemia. Generalized atrophy with mild chronic small vessel ischemia. No hydrocephalus. Vascular: Atherosclerosis of skullbase vasculature without hyperdense vessel or abnormal calcification. Skull: No fracture or focal lesion. Sinuses/Orbits: Paranasal sinuses and mastoid air cells are clear. The visualized orbits are unremarkable. Other: None. IMPRESSION: 1. Thin left  subdural hygroma measures up to 5 mm primarily in the frontal region. There is 3 mm right midline shift. No evidence of hemorrhagic component. This is age indeterminate, but new from 05/17/2014 comparison. 2. Generalized atrophy and chronic small vessel ischemia. Electronically Signed   By: Keith Rake M.D.   On: 12/05/2018 22:59   Dg Chest Portable 1 View  Result Date: 12/05/2018 CLINICAL DATA:  Cough. Confusion. Shortness of breath. EXAM: PORTABLE CHEST 1 VIEW COMPARISON:  Radiographs 06/04/2018 FINDINGS: Lower lung volumes from  prior exam. Progressive cardiomegaly with slight globular configuration. Aortic atherosclerosis. Patchy bibasilar opacities. No large pleural effusion. No pneumothorax. No acute osseous abnormalities. IMPRESSION: 1. Low lung volumes with progressive cardiomegaly over the past 7 months. Globular configuration of the cardiac silhouette can be seen with pericardial effusion. 2. Patchy bibasilar opacities, left greater than right. This may represent atelectasis, aspiration, or pneumonia. 3.  Aortic Atherosclerosis (ICD10-I70.0). Electronically Signed   By: Keith Rake M.D.   On: 12/05/2018 21:47    Pending Labs Unresulted Labs (From admission, onward)    Start     Ordered   12/05/18 2258  Comprehensive metabolic panel  Once,   STAT     12/05/18 2258   12/05/18 2258  Troponin I -  Once,   STAT     12/05/18 2258   12/05/18 2222  Urine culture  ONCE - STAT,   STAT     12/05/18 2222   12/05/18 2211  Blood culture (routine x 2)  BLOOD CULTURE X 2,   STAT     12/05/18 2210   Signed and Held  CBC  Tomorrow morning,   R     Signed and Held   Signed and Held  CBC  (heparin)  Once,   R    Comments:  Baseline for heparin therapy IF NOT ALREADY DRAWN.  Notify MD if PLT < 100 K.    Signed and Held   Signed and Held  Creatinine, serum  (heparin)  Once,   R    Comments:  Baseline for heparin therapy IF NOT ALREADY DRAWN.    Signed and Held   Signed and Held  Comprehensive  metabolic panel  Tomorrow morning,   R     Signed and Held   Signed and Held  Protime-INR  Tomorrow morning,   R     Signed and Held   Signed and Held  APTT  Tomorrow morning,   R     Signed and Held          Vitals/Pain Today's Vitals   12/05/18 2230 12/05/18 2330 12/06/18 0000 12/06/18 0030  BP: (!) 126/112     Pulse: (!) 51 (!) 50 77 75  Resp: 16 15 16 18   Temp:      TempSrc:      SpO2: 99% 100% 99% 99%  Weight:      Height:      PainSc:        Isolation Precautions Droplet precaution  Medications Medications  levofloxacin (LEVAQUIN) IVPB 750 mg (0 mg Intravenous Stopped 12/06/18 0048)  sodium chloride 0.9 % bolus 1,000 mL (0 mLs Intravenous Stopped 12/06/18 0048)  sodium chloride 0.9 % bolus 500 mL (0 mLs Intravenous Stopped 12/06/18 0048)    Mobility manual wheelchair High fall risk   Focused Assessments Neuro Assessment Handoff:  Swallow screen pass? n/a Cardiac Rhythm: Normal sinus rhythm       Neuro Assessment: Exceptions to WDL Neuro Checks:      Last Documented NIHSS Modified Score:   Has TPA been given? n/a If patient is a Neuro Trauma and patient is going to OR before floor call report to [place] nurse: [phone] or [phone]     R Recommendations: See Admitting Provider Note  Report given to:   Additional Notes:  Pt has Dementia Took two people to help pt into bed

## 2018-12-06 NOTE — Progress Notes (Signed)
*  PRELIMINARY RESULTS* Echocardiogram 2D Echocardiogram has been performed.  Dale Williams 12/06/2018, 12:53 PM

## 2018-12-06 NOTE — Consult Note (Signed)
[facility]  CONSULT NOTE    Date: 12/06/2018                  Patient Name:  Dale Williams  MRN: [id number]  DOB: July 14, 1931  Age / Sex: 85 y.o., male         PCP: Juanell Fairly, MD                 Service Requesting Consult: Dr. Manuella Ghazi                 Reason for Consult: Acute renal failure            History of Present Illness: [name] is a 85 y.o. black male presents with altered mental status. Patient with pneumonia and urinary tract infection. Patient with dementia and unable to give history.   Nephrology consulted for acute hyponatremia, acute renal failure on chronic kidney disease stage IV.    Medications: Outpatient medications: (Not in a hospital admission)   Current medications: Current Facility-Administered Medications  Medication Dose Route Frequency Provider Last Rate Last Dose  . acetaminophen (TYLENOL) tablet 650 mg  650 mg Oral Q6H PRN Sedalia Muta, MD       Or  . acetaminophen (TYLENOL) suppository 650 mg  650 mg Rectal Q6H PRN Sedalia Muta, MD      . albuterol (PROVENTIL) (2.5 MG/3ML) 0.083% nebulizer solution 2.5 mg  2.5 mg Nebulization Q2H PRN Sedalia Muta, MD      . amLODipine (NORVASC) tablet 5 mg  5 mg Oral Daily Sedalia Muta, MD   Stopped at 12/06/18 1009  . atorvastatin (LIPITOR) tablet 20 mg  20 mg Oral q1800 Sedalia Muta, MD      . calcitRIOL (ROCALTROL) capsule 0.5 mcg  0.5 mcg Oral Daily Sedalia Muta, MD   Stopped at 12/06/18 1010  . furosemide (LASIX) tablet 20 mg  20 mg Oral Daily Sedalia Muta, MD   Stopped at 12/06/18 1010  . guaiFENesin (MUCINEX) 12 hr tablet 600 mg  600 mg Oral BID Sedalia Muta, MD   Stopped at 12/06/18 1010  . heparin injection 5,000 Units  5,000 Units Subcutaneous Q12H Sedalia Muta, MD   5,000 Units at 12/06/18 1025  . [START ON 12/07/2018] levofloxacin (LEVAQUIN) IVPB 750 mg  750 mg Intravenous Q48H Shah, Vipul, MD      . metoprolol tartrate (LOPRESSOR) tablet 25 mg   25 mg Oral BID Sedalia Muta, MD   Stopped at 12/06/18 1010  . ondansetron (ZOFRAN) tablet 4 mg  4 mg Oral Q6H PRN Sedalia Muta, MD       Or  . ondansetron [facility]) injection 4 mg  4 mg Intravenous Q6H PRN Sedalia Muta, MD      . sodium chloride flush (NS) 0.9 % injection 3 mL  3 mL Intravenous Q12H Sedalia Muta, MD   3 mL at 12/06/18 1025  . tamsulosin (FLOMAX) capsule 0.4 mg  0.4 mg Oral Daily Sedalia Muta, MD   Stopped at 12/06/18 1011   Current Outpatient Medications  Medication Sig Dispense Refill  . allopurinol (ZYLOPRIM) 300 MG tablet Take 300 mg by mouth daily.     Marland Kitchen amLODipine (NORVASC) 5 MG tablet Take 5 mg by mouth daily.    Marland Kitchen atorvastatin (LIPITOR) 20 MG tablet Take 20 mg by mouth daily at 6 PM.     . calcitRIOL (ROCALTROL) 0.5 MCG capsule Take 0.5 mcg by mouth daily.     Marland Kitchen donepezil (ARICEPT) 10 MG  tablet Take 10 mg by mouth at bedtime.     Marland Kitchen levofloxacin (LEVAQUIN) 500 MG tablet Take 1 tablet by mouth daily.    . metoprolol tartrate (LOPRESSOR) 25 MG tablet Take 25 mg by mouth 2 (two) times daily.     . tamsulosin (FLOMAX) 0.4 MG CAPS capsule Take 0.4 mg by mouth daily.     . furosemide (LASIX) 20 MG tablet Take by mouth.        Allergies: Allergies  Allergen Reactions  . Penicillins Anaphylaxis      Past Medical History: Past Medical History:  Diagnosis Date  . Alzheimer's dementia ([place])   . Chronic kidney disease (CKD), stage III (moderate) ([facility])   . Colon cancer ([place])   . HFrEF (heart failure with reduced ejection fraction) ([place])   . Hypertension   . Persistent atrial fibrillation    a. CHADS2VASc 5 (CHF, HTN, age 75, vascular disease); b. not on [place] 2/2 dementia and fall risk  . Prostate cancer [facility])      Past Surgical History: Past Surgical History:  Procedure Laterality Date  . COLON SURGERY    . HERNIA REPAIR       Family History: Family History  Problem Relation Age of Onset  . Bone cancer Mother   . Hypertension Father       Social History: Social History   Socioeconomic History  . Marital status: Single    Spouse name: Not on file  . Number of children: Not on file  . Years of education: Not on file  . Highest education level: Not on file  Occupational History  . Not on file  Social Needs  . Financial resource strain: Not on file  . Food insecurity:    Worry: Not on file    Inability: Not on file  . Transportation needs:    Medical: Not on file    Non-medical: Not on file  Tobacco Use  . Smoking status: Former Smoker    Types: Cigars  . Smokeless tobacco: Never Used  . Tobacco comment: 2 cigars daily  Substance and Sexual Activity  . Alcohol use: Not Currently  . Drug use: Never  . Sexual activity: Not on file  Lifestyle  . Physical activity:    Days per week: Not on file    Minutes per session: Not on file  . Stress: Not on file  Relationships  . Social connections:    Talks on phone: Not on file    Gets together: Not on file    Attends religious service: Not on file    Active member of club or organization: Not on file    Attends meetings of clubs or organizations: Not on file    Relationship status: Not on file  . Intimate partner violence:    Fear of current or ex partner: Not on file    Emotionally abused: Not on file    Physically abused: Not on file    Forced sexual activity: Not on file  Other Topics Concern  . Not on file  Social History Narrative  . Not on file     Review of Systems: Review of Systems  Unable to perform ROS: Dementia    Vital Signs: Blood pressure 109/64, pulse 66, temperature 98 F (36.7 C), temperature source Oral, resp. rate 18, height 6\' 3"  (1.905 m), weight 71.2 kg, SpO2 100 %.  Weight trends: Filed Weights   12/05/18 2110  Weight: 71.2 kg    Physical Exam: General:  NAD, ill appearing  Head: Normocephalic, atraumatic. Moist oral mucosal membranes  Eyes: Anicteric, PERRL  Neck: Supple, trachea midline  Lungs:  Clear to  auscultation  Heart: Regular rate and rhythm  Abdomen:  Soft, nontender,   Extremities: no peripheral edema.  Neurologic: Nonfocal, moving all four extremities  Skin: No lesions         Lab results: Basic Metabolic Panel: Recent Labs  Lab 12/05/18 2255 12/06/18 0615  NA 120* 121*  K 5.2* 5.4*  CL 95* 98  CO2 15* 13*  GLUCOSE 76 73  BUN 114* 101*  CREATININE 3.37* 3.01*  CALCIUM 9.6 9.3    Liver Function Tests: Recent Labs  Lab 12/05/18 2255 12/06/18 0615  AST 119* 110*  ALT 42 33  ALKPHOS 60 51  BILITOT 0.5 0.8  PROT 7.4 6.8  ALBUMIN 3.7 3.0*   No results for input(s): LIPASE, AMYLASE in the last 168 hours. No results for input(s): AMMONIA in the last 168 hours.  CBC: Recent Labs  Lab 12/05/18 2134 12/06/18 0615  WBC 3.7* 3.5*  NEUTROABS 2.6  --   HGB 10.7* 9.8*  HCT 29.2* 26.9*  MCV 78.5* 78.0*  PLT 134* 124*    Cardiac Enzymes: Recent Labs  Lab 12/05/18 2255 12/06/18 1033 12/06/18 1511  TROPONINI 0.24* 0.24* 0.22*    BNP: Invalid input(s): POCBNP  CBG: Recent Labs  Lab 12/06/18 1024  GLUCAP 69    Microbiology: Results for orders placed or performed during the hospital encounter of 12/05/18  Blood culture (routine x 2)     Status: None (Preliminary result)   Collection Time: 12/05/18 10:55 PM  Result Value Ref Range Status   Specimen Description BLOOD LEFT ANTECUBITAL  Final   Special Requests   Final    BOTTLES DRAWN AEROBIC AND ANAEROBIC Blood Culture adequate volume   Culture   Final    NO GROWTH < 12 HOURS Performed at [facility], [address] [place], [place] [address]    Report Status PENDING  Incomplete  Blood culture (routine x 2)     Status: None (Preliminary result)   Collection Time: 12/05/18 10:55 PM  Result Value Ref Range Status   Specimen Description BLOOD BLOOD LEFT FOREARM  Final   Special Requests   Final    BOTTLES DRAWN AEROBIC AND ANAEROBIC Blood Culture adequate volume   Culture   Final     NO GROWTH < 12 HOURS Performed at [facility], [address] [place], [place] [address]    Report Status PENDING  Incomplete    Coagulation Studies: Recent Labs    12/06/18 0615  LABPROT 14.1  INR 1.1    Urinalysis: Recent Labs    12/05/18 2134  COLORURINE YELLOW*  LABSPEC 1.011  PHURINE 5.0  GLUCOSEU NEGATIVE  HGBUR NEGATIVE  BILIRUBINUR NEGATIVE  KETONESUR NEGATIVE  PROTEINUR NEGATIVE  NITRITE NEGATIVE  LEUKOCYTESUR NEGATIVE      Imaging: Ct Head Wo Contrast  Result Date: 12/05/2018 CLINICAL DATA:  Subacute neurologic deficit. Confusion. EXAM: CT HEAD WITHOUT CONTRAST TECHNIQUE: Contiguous axial images were obtained from the base of the skull through the vertex without intravenous contrast. COMPARISON:  Head CT 05/17/2014 FINDINGS: Brain: Thin left subdural hygroma measures up to 5 mm primarily in the frontal region. No evidence of hemorrhagic component. There is approximately 3 mm left right midline shift. No cyst new from prior exam. No hemorrhage or evidence of acute ischemia. Generalized atrophy with mild chronic small vessel ischemia. No hydrocephalus.  Vascular: Atherosclerosis of skullbase vasculature without hyperdense vessel or abnormal calcification. Skull: No fracture or focal lesion. Sinuses/Orbits: Paranasal sinuses and mastoid air cells are clear. The visualized orbits are unremarkable. Other: None. IMPRESSION: 1. Thin left subdural hygroma measures up to 5 mm primarily in the frontal region. There is 3 mm right midline shift. No evidence of hemorrhagic component. This is age indeterminate, but new from 05/17/2014 comparison. 2. Generalized atrophy and chronic small vessel ischemia. Electronically Signed   By: Keith Rake M.D.   On: 12/05/2018 22:59   Dg Chest Portable 1 View  Result Date: 12/05/2018 CLINICAL DATA:  Cough. Confusion. Shortness of breath. EXAM: PORTABLE CHEST 1 VIEW COMPARISON:  Radiographs 06/04/2018 FINDINGS: Lower lung  volumes from prior exam. Progressive cardiomegaly with slight globular configuration. Aortic atherosclerosis. Patchy bibasilar opacities. No large pleural effusion. No pneumothorax. No acute osseous abnormalities. IMPRESSION: 1. Low lung volumes with progressive cardiomegaly over the past 7 months. Globular configuration of the cardiac silhouette can be seen with pericardial effusion. 2. Patchy bibasilar opacities, left greater than right. This may represent atelectasis, aspiration, or pneumonia. 3.  Aortic Atherosclerosis (ICD10-I70.0). Electronically Signed   By: Keith Rake M.D.   On: 12/05/2018 21:47      Assessment & Plan: Mr. Shuayb Schepers is a 85 y.o. black male with dementia, hypertension, prostate cancer, atrial fibrillation, congestive heart failure, colon cancer, who was admitted to [facility] on 12/05/2018 for SOB/AMS with HCAP  1. Acute renal failure 2. Chronic Kidney Disease stage IV 3. Hyponatremia 4. Hyperkalemia 5. Metabolic acidosis  6. Anemia with kidney failure  Plan  Continue furosemide.  [place] levofloxacin.     LOS: 0 Dilynn Munroe 3/3/20204:37 PM

## 2018-12-06 NOTE — Plan of Care (Signed)

## 2018-12-06 NOTE — ED Notes (Addendum)
MD Manuella Ghazi made aware pt is pocketing saliva. Recommendation for pt to be NPO at this time w/o maintenance ivf.

## 2018-12-06 NOTE — ED Notes (Signed)
Date and time results received: 12/06/18 0110 (use smartphrase ".now" to insert current time)  Test: troponin Critical Value: 0.24  Name of Provider Notified: Dr. Owens Shark  Orders Received? Or Actions Taken?:

## 2018-12-07 DIAGNOSIS — L8990 Pressure ulcer of unspecified site, unspecified stage: Secondary | ICD-10-CM

## 2018-12-07 DIAGNOSIS — I5022 Chronic systolic (congestive) heart failure: Secondary | ICD-10-CM

## 2018-12-07 LAB — RENAL FUNCTION PANEL
ALBUMIN: 3 g/dL — AB (ref 3.5–5.0)
Anion gap: 9 (ref 5–15)
BUN: 93 mg/dL — ABNORMAL HIGH (ref 8–23)
CALCIUM: 9.5 mg/dL (ref 8.9–10.3)
CO2: 14 mmol/L — ABNORMAL LOW (ref 22–32)
Chloride: 102 mmol/L (ref 98–111)
Creatinine, Ser: 2.95 mg/dL — ABNORMAL HIGH (ref 0.61–1.24)
GFR calc Af Amer: 21 mL/min — ABNORMAL LOW (ref >60)
GFR, EST NON AFRICAN AMERICAN: 18 mL/min — AB (ref >60)
Glucose, Bld: 90 mg/dL (ref 70–99)
Phosphorus: 4.8 mg/dL — ABNORMAL HIGH (ref 2.5–4.6)
Potassium: 5.1 mmol/L (ref 3.5–5.1)
Sodium: 125 mmol/L — ABNORMAL LOW (ref 135–145)

## 2018-12-07 LAB — CBC
HCT: 27.3 % — ABNORMAL LOW (ref 39.0–52.0)
Hemoglobin: 10.1 g/dL — ABNORMAL LOW (ref 13.0–17.0)
MCH: 29.2 pg (ref 26.0–34.0)
MCHC: 37 g/dL — ABNORMAL HIGH (ref 30.0–36.0)
MCV: 78.9 fL — ABNORMAL LOW (ref 80.0–100.0)
Platelets: 122 10*3/uL — ABNORMAL LOW (ref 150–400)
RBC: 3.46 MIL/uL — ABNORMAL LOW (ref 4.22–5.81)
RDW: 17.5 % — ABNORMAL HIGH (ref 11.5–15.5)
WBC: 2.9 10*3/uL — AB (ref 4.0–10.5)
nRBC: 1.4 % — ABNORMAL HIGH (ref 0.0–0.2)

## 2018-12-07 LAB — ECHOCARDIOGRAM COMPLETE
Height: 75 in
Weight: 2512 oz

## 2018-12-07 LAB — PROCALCITONIN: Procalcitonin: 0.15 ng/mL

## 2018-12-07 LAB — GLUCOSE, CAPILLARY
GLUCOSE-CAPILLARY: 117 mg/dL — AB (ref 70–99)
Glucose-Capillary: 52 mg/dL — ABNORMAL LOW (ref 70–99)

## 2018-12-07 LAB — URINE CULTURE: Culture: NO GROWTH

## 2018-12-07 MED ORDER — ORAL CARE MOUTH RINSE
15.0000 mL | Freq: Two times a day (BID) | OROMUCOSAL | Status: DC
  Administered 2018-12-07 – 2018-12-08 (×2): 15 mL via OROMUCOSAL

## 2018-12-07 MED ORDER — ZINC OXIDE 40 % EX OINT
TOPICAL_OINTMENT | Freq: Every morning | CUTANEOUS | Status: DC
  Filled 2018-12-07: qty 113

## 2018-12-07 MED ORDER — SODIUM CHLORIDE 0.9% FLUSH
3.0000 mL | Freq: Two times a day (BID) | INTRAVENOUS | Status: DC
  Administered 2018-12-07 – 2018-12-09 (×5): 3 mL via INTRAVENOUS

## 2018-12-07 MED ORDER — DEXTROSE 50 % IV SOLN
12.5000 g | INTRAVENOUS | Status: AC
  Administered 2018-12-07: 12.5 g via INTRAVENOUS
  Filled 2018-12-07: qty 50

## 2018-12-07 MED ORDER — ZINC OXIDE 40 % EX OINT
TOPICAL_OINTMENT | Freq: Four times a day (QID) | CUTANEOUS | Status: DC
  Administered 2018-12-07 – 2018-12-08 (×3): via TOPICAL
  Administered 2018-12-08: 1 via TOPICAL
  Administered 2018-12-08 – 2018-12-09 (×4): via TOPICAL
  Filled 2018-12-07: qty 113

## 2018-12-07 MED ORDER — LEVOFLOXACIN IN D5W 500 MG/100ML IV SOLN
500.0000 mg | INTRAVENOUS | Status: DC
  Administered 2018-12-07: 500 mg via INTRAVENOUS
  Filled 2018-12-07: qty 100

## 2018-12-07 MED ORDER — CARVEDILOL 6.25 MG PO TABS: 6.2500 mg | ORAL_TABLET | Freq: Two times a day (BID) | ORAL | Status: DC

## 2018-12-07 MED ORDER — CARVEDILOL 3.125 MG PO TABS
3.1250 mg | ORAL_TABLET | Freq: Two times a day (BID) | ORAL | Status: DC
  Filled 2018-12-07: qty 1

## 2018-12-07 MED ORDER — SODIUM CHLORIDE 0.9% FLUSH: 3.0000 mL | INTRAVENOUS | Status: DC | PRN

## 2018-12-07 NOTE — Consult Note (Signed)
[place] Nurse wound consult note Patient receiving care in [facility] 247.  No family present. Patient unable to give history. Reason for Consult:"admitted with a stage 2 ulcer" Wound type: There is a partial thickness loss of skin at the very top of the gluteal fold that measures approximately 1 cm x 0.5 cm and over at the top of the right buttock that measures approximately 2 cm x 3 cm.  Neither of these have measureable depth, but the top layer of epithelial tissue is definitely gone.  Both wound beds are pink and clean, without odor, drainage, or induration.  I am not sure they are pressure related.  They may be more related to friction, shear, and MASD from incontinence.  The right forearm has a large area that appears as an abrasion, something you might see after a fall.  There is no sign of infection to this area, no odor, no drainage, no induration. POA: Yes Periwound: intact, normal color and texture Dressing procedure/placement/frequency: Desitin 40%, apply to the coccyx and buttocks 4 times daily.  Apply to the right forearm once daily and cover with a Vaseline gauze and wrap with kerlex. Additional care measures include: a low air loss mattress; bilateral prevalon boots, turn the patient every 2 hours, Use ONLY ONE DermaTherapy pad beneath patient's buttocks. Do NOT use disposable pads.  Monitor the wound area(s) for worsening of condition such as: Signs/symptoms of infection,  Increase in size,  Development of or worsening of odor, Development of pain, or increased pain at the affected locations.  Notify the medical team if any of these develop.  Thank you for the consult.  Discussed plan of care with the patient and bedside nurse.  [place] nurse will not follow at this time.  Please re-consult the [place] team if needed.  Val Riles, RN, MSN, CWOCN, CNS-BC, pager [phone]

## 2018-12-07 NOTE — Progress Notes (Signed)
Referral received for DM Coordinator referral.  Review of chart indicates not history of DM or DM medications.  Will sign off referral.  Notified MD.   Thanks  Adah Perl, RN, BC-ADM Inpatient Diabetes Coordinator Pager [phone] ([phone])

## 2018-12-07 NOTE — Progress Notes (Signed)
Progress Note  Patient Name: Dale Williams Date of Encounter: 12/07/2018  Primary Cardiologist: [facility]  Subjective   No acute overnight events. Denies any chest pain, SOB, palpitations, or dizziness. Troponin minimally elevated and flat trending with a peak of 0.24. No follow up renal function, sodium, or potassium this morning. Echo showed an EF of 35-40% with prior in 08/2018 noted EF to be 45-50%.  Inpatient Medications    Scheduled Meds: . atorvastatin  20 mg Oral q1800  . calcitRIOL  0.5 mcg Oral Daily  . carvedilol  6.25 mg Oral BID WC  . furosemide  20 mg Oral Daily  . guaiFENesin  600 mg Oral BID  . heparin  5,000 Units Subcutaneous Q12H  . sodium chloride flush  3 mL Intravenous Q12H  . tamsulosin  0.4 mg Oral Daily   Continuous Infusions: . levofloxacin (LEVAQUIN) IV     PRN Meds: acetaminophen **OR** acetaminophen, albuterol, ondansetron **OR** ondansetron (ZOFRAN) IV   Vital Signs    Vitals:   12/06/18 1700 12/06/18 1841 12/06/18 2004 12/07/18 0443  BP: (!) 102/58 101/65 120/74 110/60  Pulse:  68 75 92  Resp: 15 16 20 20   Temp:  98.4 F (36.9 C) 97.6 F (36.4 C) 98.2 F (36.8 C)  TempSrc:      SpO2: 98% 100% 100% 100%  Weight:  69.7 kg  69.4 kg  Height:  5\' 11"  (1.803 m)      Intake/Output Summary (Last 24 hours) at 12/07/2018 0800 Last data filed at 12/07/2018 0600 Gross per 24 hour  Intake -  Output 500 ml  Net -500 ml   Filed Weights   12/05/18 2110 12/06/18 1841 12/07/18 0443  Weight: 71.2 kg 69.7 kg 69.4 kg    Telemetry    Afib with occasional PVCs vs aberrancy - Personally Reviewed  ECG    n/a - Personally Reviewed  Physical Exam   GEN: Elderly and frail appearing; No acute distress.   Neck: No JVD. Cardiac: Irregularly irregular, no murmurs, rubs, or gallops.  Respiratory: Diminished along the bases bilaterally.  GI: Soft, nontender, non-distended.   MS: No edema; No deformity. Neuro:  Alert and oriented x 3; Nonfocal.  Psych:  Normal affect.  Labs    Chemistry Recent Labs  Lab 12/05/18 2255 12/06/18 0615  NA 120* 121*  K 5.2* 5.4*  CL 95* 98  CO2 15* 13*  GLUCOSE 76 73  BUN 114* 101*  CREATININE 3.37* 3.01*  CALCIUM 9.6 9.3  PROT 7.4 6.8  ALBUMIN 3.7 3.0*  AST 119* 110*  ALT 42 33  ALKPHOS 60 51  BILITOT 0.5 0.8  GFRNONAA 16* 18*  GFRAA 18* 21*  ANIONGAP 10 10     Hematology Recent Labs  Lab 12/05/18 2134 12/06/18 0615  WBC 3.7* 3.5*  RBC 3.72* 3.45*  HGB 10.7* 9.8*  HCT 29.2* 26.9*  MCV 78.5* 78.0*  MCH 28.8 28.4  MCHC 36.6* 36.4*  RDW 17.2* 17.2*  PLT 134* 124*    Cardiac Enzymes Recent Labs  Lab 12/05/18 2255 12/06/18 1033 12/06/18 1511 12/06/18 2056  TROPONINI 0.24* 0.24* 0.22* 0.22*   No results for input(s): TROPIPOC in the last 168 hours.   BNP Recent Labs  Lab 12/05/18 2134  BNP 1,473.0*     DDimer No results for input(s): DDIMER in the last 168 hours.   Radiology    Ct Head Wo Contrast  Result Date: 12/05/2018 IMPRESSION: 1. Thin left subdural hygroma measures up to 5 mm  primarily in the frontal region. There is 3 mm right midline shift. No evidence of hemorrhagic component. This is age indeterminate, but new from 05/17/2014 comparison. 2. Generalized atrophy and chronic small vessel ischemia. Electronically Signed   By: Keith Rake M.D.   On: 12/05/2018 22:59   Dg Chest Portable 1 View  Result Date: 12/05/2018 IMPRESSION: 1. Low lung volumes with progressive cardiomegaly over the past 7 months. Globular configuration of the cardiac silhouette can be seen with pericardial effusion. 2. Patchy bibasilar opacities, left greater than right. This may represent atelectasis, aspiration, or pneumonia. 3.  Aortic Atherosclerosis (ICD10-I70.0). Electronically Signed   By: Keith Rake M.D.   On: 12/05/2018 21:47    Cardiac Studies   Echo 12/06/2018: IMPRESSIONS    1. The left ventricle has moderately reduced systolic function, with an ejection fraction  of 35-40%. The cavity size was normal. There is mildly increased left ventricular wall thickness. Left ventricular diastology could not be evaluated secondary to  atrial fibrillation.  2. The right ventricle has mildly reduced systolic function. The cavity was normal. There is no increase in right ventricular wall thickness. Right ventricular systolic pressure is moderately elevated.  3. Left atrial size was mildly dilated.  4. Right atrial size was mildly dilated.  5. Trivial pericardial effusion is present.  6. The mitral valve is degenerative. Moderate thickening of the mitral valve leaflet. There is moderate mitral annular calcification present.  7. The tricuspid valve is not well visualized. Tricuspid valve regurgitation is mild-moderate.  8. The aortic valve was not well visualized Moderate thickening of the aortic valve Moderate calcification of the aortic valve.  9. The aortic root is normal in size and structure. 10. The interatrial septum was not well visualized.  Patient Profile     85 y.o. male with history of HFrEF, persistent A. fib not on oral anticoagulation, pulmonary hypertension, Alzheimer's dementia, colon cancer status post surgical resection, CKD stage III secondary to hypertension, anemia of chronic disease, frequent falls, and frequent UTIs with history of pneumonia who is being seen today for the evaluation of elevated troponin at the request of Dr. Manuella Ghazi.  Assessment & Plan    1. Elevated troponin: -Not consistent with ACS -Likely supply demand ischemia secondary to acute on chronic CKD stage III, anemia of chronic disease with a current hemoglobin of 9.8 with a baseline around 11, and suspected pneumonia -Patient is not a candidate for any invasive cardiac testing/intervention given his advanced age, dementia, chronic kidney disease, anemia, and frequent falls -Echo as above -Would not place patient on heparin drip at this time given lack of objective evidence  indicating this being ACS  2.  Acute on chronic HFrEF/pulmonary hypertension: -He appears well compensated and euvolemic -No prior ischemic evaluation -Prior EF known to be 45 to 50% in 08/2018 -EF this admission 35-40% -Discontinue amlodipine and Lopressor -Start Coreg 6.25 mg bid -He likely has poor PO intake and does not appear volume up, in this setting, we will defer initiation of diuretic at this time -Daily weights/strict I's and O's  3.  Persistent A. Fib: -Ventricular rates are reasonably controlled -Continue rate control with Coreg -Not on long-term, full dose oral anticoagulation in the setting of frequent falls with documented left shoulder hematoma on exam today, dementia, advanced kidney disease, and anemia -Without any family present to discuss this further, I would continue to recommend no full dose anticoagulation  4.  CAP: -Per IM  5.  Alzheimer's dementia: -Per IM  6.  Hyperkalemia: -No medications to discontinue -No labs this morning -Recommend close following by internal medicine  7.  Pancytopenia: -Work-up deferred to internal medicine  8.  Hyponatremia:  -Likely contributing to the patient's worsened altered state -Work-up per internal medicine  For questions or updates, please contact [place] Please consult [email or web address] for contact info under Cardiology/STEMI.    Signed, Christell Faith, PA-C [facility] Pager: [phone] 12/07/2018, 8:00 AM

## 2018-12-07 NOTE — Progress Notes (Signed)
Hypoglycemic Event  CBG: 52 Treatment: 12.5 G of 50% Dextrose IVP  Symptoms: none Follow-up CBG: Time:1016 CBG Result:117 Possible Reasons for Event: Patient is NPO Comments/MD notified:  Dr. Manuella Ghazi informed.   Remigio Eisenmenger

## 2018-12-07 NOTE — Progress Notes (Signed)
Patient has no acute event overnight, remained in asymptomatic afib, with stable VS. Patient was alert and oriented to self , and was unable to provide answer to admission questions .Patient has generalized skin abrasions and stage 2 pressure ulcer on his coccyx and sacrum and his right second toe.

## 2018-12-07 NOTE — Progress Notes (Signed)
[facility]  ROUNDING NOTE   Subjective:   Patient pleasantly confused.   UOP 500  Creatinine 2.95 (3.01) K 5.1 (5.4)  Objective:  Vital signs in last 24 hours:  Temp:  [97.6 F (36.4 C)-98.4 F (36.9 C)] 98.2 F (36.8 C) (03/04 0443) Pulse Rate:  [66-92] 74 (03/04 0819) Resp:  [10-20] 10 (03/04 0819) BP: (101-120)/(57-77) 106/57 (03/04 0819) SpO2:  [95 %-100 %] 100 % (03/04 0819) Weight:  [69.4 kg-69.7 kg] 69.4 kg (03/04 0443)  Weight change: -1.497 kg Filed Weights   12/05/18 2110 12/06/18 1841 12/07/18 0443  Weight: 71.2 kg 69.7 kg 69.4 kg    Intake/Output: I/O last 3 completed shifts: In: -  Out: 500 [Urine:500]   Intake/Output this shift:  No intake/output data recorded.  Physical Exam: General: NAD, laying in bed  Head: Normocephalic, atraumatic. Moist oral mucosal membranes  Eyes: Anicteric, PERRL  Neck: Supple, trachea midline  Lungs:  Clear to auscultation  Heart: Regular rate and rhythm  Abdomen:  Soft, nontender,   Extremities:  no peripheral edema.  Neurologic: Alert to self  Skin: No lesions        Basic Metabolic Panel: Recent Labs  Lab 12/05/18 2255 12/06/18 0615 12/07/18 0907  NA 120* 121* 125*  K 5.2* 5.4* 5.1  CL 95* 98 102  CO2 15* 13* 14*  GLUCOSE 76 73 90  BUN 114* 101* 93*  CREATININE 3.37* 3.01* 2.95*  CALCIUM 9.6 9.3 9.5  PHOS  --   --  4.8*    Liver Function Tests: Recent Labs  Lab 12/05/18 2255 12/06/18 0615 12/07/18 0907  AST 119* 110*  --   ALT 42 33  --   ALKPHOS 60 51  --   BILITOT 0.5 0.8  --   PROT 7.4 6.8  --   ALBUMIN 3.7 3.0* 3.0*   No results for input(s): LIPASE, AMYLASE in the last 168 hours. No results for input(s): AMMONIA in the last 168 hours.  CBC: Recent Labs  Lab 12/05/18 2134 12/06/18 0615  WBC 3.7* 3.5*  NEUTROABS 2.6  --   HGB 10.7* 9.8*  HCT 29.2* 26.9*  MCV 78.5* 78.0*  PLT 134* 124*    Cardiac Enzymes: Recent Labs  Lab 12/05/18 2255 12/06/18 1033  12/06/18 1511 12/06/18 2056  TROPONINI 0.24* 0.24* 0.22* 0.22*    BNP: Invalid input(s): POCBNP  CBG: Recent Labs  Lab 12/06/18 1024 12/07/18 0822 12/07/18 1016  GLUCAP 72 52* 117*    Microbiology: Results for orders placed or performed during the hospital encounter of 12/05/18  Urine culture     Status: None   Collection Time: 12/05/18  9:34 PM  Result Value Ref Range Status   Specimen Description   Final    URINE, RANDOM Performed at [facility], [address], [place], [place] [address]    Special Requests   Final    NONE Performed at [facility], [address] [place], [place] [address]    Culture   Final    NO GROWTH Performed at [facility], [place] [address] [place],  [address]    Report Status 12/07/2018 FINAL  Final  Blood culture (routine x 2)     Status: None (Preliminary result)   Collection Time: 12/05/18 10:55 PM  Result Value Ref Range Status   Specimen Description BLOOD LEFT ANTECUBITAL  Final   Special Requests   Final    BOTTLES DRAWN AEROBIC AND ANAEROBIC Blood Culture adequate volume  Culture   Final    NO GROWTH 2 DAYS Performed at [facility], [address] [place], [place] [address]    Report Status PENDING  Incomplete  Blood culture (routine x 2)     Status: None (Preliminary result)   Collection Time: 12/05/18 10:55 PM  Result Value Ref Range Status   Specimen Description BLOOD BLOOD LEFT FOREARM  Final   Special Requests   Final    BOTTLES DRAWN AEROBIC AND ANAEROBIC Blood Culture adequate volume   Culture   Final    NO GROWTH 2 DAYS Performed at [facility], [address], [place], [place] [address]    Report Status PENDING  Incomplete    Coagulation Studies: Recent Labs    12/06/18 0615  LABPROT 14.1  INR 1.1    Urinalysis: Recent Labs    12/05/18 2134  COLORURINE YELLOW*  LABSPEC 1.011  PHURINE 5.0  GLUCOSEU NEGATIVE  HGBUR NEGATIVE  BILIRUBINUR  NEGATIVE  KETONESUR NEGATIVE  PROTEINUR NEGATIVE  NITRITE NEGATIVE  LEUKOCYTESUR NEGATIVE      Imaging: Ct Head Wo Contrast  Result Date: 12/05/2018 CLINICAL DATA:  Subacute neurologic deficit. Confusion. EXAM: CT HEAD WITHOUT CONTRAST TECHNIQUE: Contiguous axial images were obtained from the base of the skull through the vertex without intravenous contrast. COMPARISON:  Head CT 05/17/2014 FINDINGS: Brain: Thin left subdural hygroma measures up to 5 mm primarily in the frontal region. No evidence of hemorrhagic component. There is approximately 3 mm left right midline shift. No cyst new from prior exam. No hemorrhage or evidence of acute ischemia. Generalized atrophy with mild chronic small vessel ischemia. No hydrocephalus. Vascular: Atherosclerosis of skullbase vasculature without hyperdense vessel or abnormal calcification. Skull: No fracture or focal lesion. Sinuses/Orbits: Paranasal sinuses and mastoid air cells are clear. The visualized orbits are unremarkable. Other: None. IMPRESSION: 1. Thin left subdural hygroma measures up to 5 mm primarily in the frontal region. There is 3 mm right midline shift. No evidence of hemorrhagic component. This is age indeterminate, but new from 05/17/2014 comparison. 2. Generalized atrophy and chronic small vessel ischemia. Electronically Signed   By: Keith Rake M.D.   On: 12/05/2018 22:59   Dg Chest Portable 1 View  Result Date: 12/05/2018 CLINICAL DATA:  Cough. Confusion. Shortness of breath. EXAM: PORTABLE CHEST 1 VIEW COMPARISON:  Radiographs 06/04/2018 FINDINGS: Lower lung volumes from prior exam. Progressive cardiomegaly with slight globular configuration. Aortic atherosclerosis. Patchy bibasilar opacities. No large pleural effusion. No pneumothorax. No acute osseous abnormalities. IMPRESSION: 1. Low lung volumes with progressive cardiomegaly over the past 7 months. Globular configuration of the cardiac silhouette can be seen with pericardial effusion.  2. Patchy bibasilar opacities, left greater than right. This may represent atelectasis, aspiration, or pneumonia. 3.  Aortic Atherosclerosis (ICD10-I70.0). Electronically Signed   By: Keith Rake M.D.   On: 12/05/2018 21:47     Medications:   . levofloxacin (LEVAQUIN) IV     . atorvastatin  20 mg Oral q1800  . calcitRIOL  0.5 mcg Oral Daily  . carvedilol  3.125 mg Oral BID WC  . guaiFENesin  600 mg Oral BID  . heparin  5,000 Units Subcutaneous Q12H  . liver oil-zinc oxide   Topical QID  . liver oil-zinc oxide   Topical q morning - 10a  . sodium chloride flush  3 mL Intravenous Q12H  . sodium chloride flush  3 mL Intravenous Q12H  . tamsulosin  0.4 mg Oral Daily   acetaminophen **OR** acetaminophen, albuterol,  ondansetron **OR** ondansetron (ZOFRAN) IV, sodium chloride flush  Assessment/ Plan:  Mr. Dale Williams is a 85 y.o. black male  Mr. Dale Williams is a 85 y.o. black male with dementia, hypertension, prostate cancer, atrial fibrillation, congestive heart failure, colon cancer, who was admitted to [facility] on 12/05/2018 for SOB/AMS with HCAP  1. Acute renal failure 2. Chronic Kidney Disease stage IV baseline creatinine of 2.6, GFR of 25 on 10/09/2018 3. Hyponatremia 4. Hyperkalemia 5. Metabolic acidosis  6. Anemia with kidney failure  Plan  Continue furosemide.  Emperic levofloxacin.  Fluid restriction   LOS: 1 Eusevio Schriver 3/4/202011:51 AM

## 2018-12-07 NOTE — Clinical Social Work Note (Signed)
CSW was informed that patient may be followed by APS.  CSW attempted to contact APS, left a message on voice mail, awaiting for call back.  CSW to continue to follow patient's progress, CSW was informed that patient may need SNF for short term rehab.  CSW to follow patient's progress throughout discharge planning.  Jones Broom. Norval Morton, MSW, [place]  12/07/2018 5:57 PM

## 2018-12-07 NOTE — Plan of Care (Signed)
PMT note:   Patient is confused. He states he is at the [facility] hospital and is here because he has nowhere else to go. He has cream on his right arm but is unable to tell me why. He states he lives with his nephew. Attempted to reach his emergency contact Pauline Aus without success. VM left with team phone number.

## 2018-12-07 NOTE — Evaluation (Addendum)
Clinical/Bedside Swallow Evaluation Patient Details  Name: Dale Williams MRN: [id number] Date of Birth: 29-Mar-1931  Today's Date: 12/07/2018 Time: SLP Start Time (ACUTE ONLY): 0850 SLP Stop Time (ACUTE ONLY): 0950 SLP Time Calculation (min) (ACUTE ONLY): 60 min  Past Medical History:  Past Medical History:  Diagnosis Date  . Alzheimer's dementia ([place])   . Chronic kidney disease (CKD), stage III (moderate) (H[facility])   . Colon cancer ([place])   . HFrEF (heart failure with reduced ejection fraction) ([place])   . Hypertension   . Persistent atrial fibrillation    a. CHADS2VASc 5 (CHF, HTN, age 12, vascular disease); b. not on [place] 2/2 dementia and fall risk  . Prostate cancer [facility])    Past Surgical History:  Past Surgical History:  Procedure Laterality Date  . COLON SURGERY    . HERNIA REPAIR     HPI: Pt is a 85 y.o. male with a known history of Alzheimer's Dementia and renal disease brought by family member for evaluation of generalized weakness, cough and concerns for UTI.  Family also noticed patient has change in mental status.  Patient has previous history of pneumonia and UTI.  Patient gets similar changes with infection.  Patient is not able to provide history secondary to dementia.  As per nephew patient is feeling weakness for last 1 to 2 weeks.  Patient was recently diagnosed with an UTI and finished antibiotic treatment.  Patient is still weak and not feeling well.  In emergency room patient underwent initial evaluation that suggestive of pneumonia.  CT head showed hygroma.  ER doctor has discussed case with neurosurgeon who recommended no further intervention or evaluation. CXR: Patchy bibasilar opacities, left greater than right. This may reflect atelectasis, pneumonia. Progressive cardiomegaly.    Assessment / Plan / Recommendation Clinical Impression  Pt appears to present w/ mild oral phase dysphagia c/b increased oral phase time and more of a munching pattern w/ trials of solid foods.  Pt does have a baseline of Dementia which may be impacting the oral phase of swallowing. Given time, pt munch/softened the trials then masticated and swallowed; oral clearing was appropriate post all trials given. During the pharyngeal phase, pt exhibited a mild cough 1x w/ ~16 ozs of thin liquids VIA CUP. No decline in resipratory status or vocal quality was noted during/post the po trials. Pt exhibited min decreased awareness of following aspiration precautions and impulsivity tilting head back to drain the last of the drink from cup; this may have contributed to the cough noted. Pt was encouraged to drink slowly taking smaller sips one at a time. No straws were given. pt was shaky in his UEs but appeared to adequately control the cup when drinking. Pt was able to feed self w/ setup and support. OM exam revealed no unilateral weakness or anterior spillage/residue during trials.  Recommend a Dysphagia level 2 (MINCED foods) w/ thin liquids VIA CUP w/ NO STRAWS; aspiration precautions and monitoring for any overt s/s of aspiration w/ foods/liquids and for any Pulmonary decline w/ oral intake - if so, then pt may need to move to thickened liquids in his diet. Recommend Pills given in Puree for safer swallowing. Support at meals for tray setup, feeding. ST services will montior pt's status while admitted.  SLP Visit Diagnosis: Dysphagia, oropharyngeal phase (R13.12)(baseline Dementia)    Aspiration Risk  Mild aspiration risk(d/t baseline Dementia)    Diet Recommendation  dysphagia level 2 (MINCED foods moistened well); Thin liquids VIA CUP w/ NO STRAWS. Aspiration  precautions. Support at meals w/ feeding.   Medication Administration: Whole meds with puree(or Crushed as needed)    Other  Recommendations Recommended Consults: (dietician f/u) Oral Care Recommendations: Oral care BID;Staff/trained caregiver to provide oral care Other Recommendations: (n/a at this time)   Follow up Recommendations None(TBD)       Frequency and Duration min 2x/week  1 week       Prognosis Prognosis for Safe Diet Advancement: Fair(-Good) Barriers to Reach Goals: Cognitive deficits;Time post onset;Severity of deficits      Swallow Study   General Date of Onset: 12/06/18 HPI: CXR: Patchy bibasilar opacities, left greater than right. This may reflect atelectasis, pneumonia. Progressive cardiomegaly.  Type of Study: Bedside Swallow Evaluation Previous Swallow Assessment: none reported Diet Prior to this Study: NPO Temperature Spikes Noted: No(wbc 3.5) Respiratory Status: Room air History of Recent Intubation: No Behavior/Cognition: Alert;Cooperative;Pleasant mood;Confused;Distractible;Requires cueing(baseline Dementia) Oral Cavity Assessment: Dry(min sticky) Oral Care Completed by SLP: Yes Oral Cavity - Dentition: Adequate natural dentition Vision: Functional for self-feeding Self-Feeding Abilities: Able to feed self;Needs assist;Needs set up(shakiness) Patient Positioning: Upright in bed(needed positioning) Baseline Vocal Quality: Normal;Low vocal intensity Volitional Cough: Strong Volitional Swallow: Able to elicit(given time and cues)    Oral/Motor/Sensory Function Overall Oral Motor/Sensory Function: Within functional limits(w/ general movements)   Ice Chips Ice chips: Within functional limits Presentation: Spoon(2 trials)   Thin Liquid Thin Liquid: Within functional limits Presentation: Cup;Self Fed(~16 ozs total) Other Comments: min impulsive tilting head back to drain the last of the drink from cup; mild cough x1 during all intake    Nectar Thick Nectar Thick Liquid: Not tested   Honey Thick Honey Thick Liquid: Not tested   Puree Puree: Within functional limits Presentation: Self Fed;Spoon(assisted; ~3-4 ozs total)   Solid     Solid: Impaired(mech soft trials) Presentation: Self Fed;Spoon(assisted; 6 trials) Oral Phase Impairments: Impaired mastication Oral Phase Functional  Implications: Impaired mastication;Prolonged oral transit(munching pattern; softening the trials) Pharyngeal Phase Impairments: (none)       Orinda Kenner, MS, CCC-SLP Watson,Katherine 12/07/2018,10:29 AM

## 2018-12-07 NOTE — Progress Notes (Signed)
[place] [facility] [place] NAME: Dale Williams    MR#:  [id number]  DATE OF BIRTH:  [birth year]  SUBJECTIVE:  CHIEF COMPLAINT:   Chief Complaint  Patient presents with  . Altered Mental Status   Low blood glucose of 52 today.  D5W given. Is pleasantly confused. Does not complain of any pain or discomfort when asked.  I spoke with his great great nephew Dale Williams who is his primary caretaker as well as his 3.  He states that the patient was independent prior to last October. Has home health RN and home health PT through [facility], at this time his hope is for the patient to return home after clinically improved with services. He is open to speaking with palliative care.  REVIEW OF SYSTEMS:  ROS Not able to assess due to mental status DRUG ALLERGIES:   Allergies  Allergen Reactions  . Penicillins Anaphylaxis   VITALS:  Blood pressure (!) 92/57, pulse 70, temperature 98.3 F (36.8 C), resp. rate 19, height 5\' 11"  (1.803 m), weight 69.4 kg, SpO2 99 %. PHYSICAL EXAMINATION:  GENERAL:  85 y.o.-year-old patient lying in the bed with no acute distress.  Pleasantly confused. EYES: Pupils equal, round, reactive to light and accommodation. No scleral icterus. Extraocular muscles intact.   HEENT: Head atraumatic, normocephalic. Oropharynx and nasopharynx clear.  NECK:  Supple, no jugular venous distention. No thyroid enlargement, no tenderness.  LUNGS:  Diffusely diminished breath sounds bilaterally.  No coarse breath sounds wheezes or crackles noted today CARDIOVASCULAR: Irregularly irregular. No murmurs, rubs, or gallops.  No extremity edema.  No JVD. ABDOMEN: Soft, nontender, nondistended. Bowel sounds present. No organomegaly or mass.  EXTREMITIES: upper extremity bruising and abrasion marks noted.  NEUROLOGIC:  Nonfocal PSYCHIATRIC:  Pleasantly confused.  Alert. SKIN:  Warm and dry.  Bruising and abrasion at the right  forearm. LABORATORY PANEL:  Male CBC Recent Labs  Lab 12/06/18 0615  WBC 3.5*  HGB 9.8*  HCT 26.9*  PLT 124*   ------------------------------------------------------------------------------------------------------------------ Chemistries  Recent Labs  Lab 12/06/18 0615 12/07/18 0907  NA 121* 125*  K 5.4* 5.1  CL 98 102  CO2 13* 14*  GLUCOSE 73 90  BUN 101* 93*  CREATININE 3.01* 2.95*  CALCIUM 9.3 9.5  AST 110*  --   ALT 33  --   ALKPHOS 51  --   BILITOT 0.8  --    RADIOLOGY:  No results found. ASSESSMENT AND PLAN:   Mr.Dale Williams a 85 y.o.malewith history of dementia, hypertension, prostate cancer, atrial fibrillation, congestive heart failure, and colon cancer, who was admitted to [facility] [place] on3/2/2020for SOB/AMSwith HCAP.  Plan  # Community-acquired pneumonia: IV Levaquin since 12/05/2018.  Symptomatic treatment.  Follow-up culture obtained in the emergency room: no growth 2 days. Procalcitonin 0.15. Influenza negative. Respiratory panel pending. CXR on admission showed patchy bibasilar opacities. Leukopenia. Afebrile.   # Altered mental status # Baseline dementia  Likely related to infection. With baseline dementia.  Fall precautions.  Aspiration precautions. Abnormal CT scan showing hygroma discussed with neurosurgery by ER physician.  No further intervention recommended.   # Recent UTI: Continue Levaquin.    # Generalized weakness: Physical therapy and speech therapy evaluation. Speech therapy ordered dysphagia diet.   # CKD Stage III # Hyperkalemia Nephrology following. Continue current plan. Hyperkalemia resolving. Monitor renal function panel daily as previous.    # HFrEF No further intervention. Stopped amlodipine and started carvedilol. If blood  pressure tolerates in the future, hydralazine/isosorbide dinitrate would also be an option. Recommend stopping ongoing diuresis. Defer anticoagulation.  Outpatient follow-up with [facility] cardiology  #  Elevated troponin # Abnormal EKG Seen by cardiology. No further intervention. Low dose aspirin if hemoglobin stabilizes. However high fall risk. Suspect troponin elevation due to renal failure.    #Persistent atrial fibrillation  Carvedilol continue   # Microcytic anemia  # Pancytopenia Per Dr. Manuella Ghazi will continue to monitor, defer invasive workup such as CT abdomen pending palliative care conversations to follow with family.  No recent occult bleeding endorsed by family.  He has a history of colon cancer per chart. Not an ideal candidate for intervention.   # Hyponatremia Up-trending. Monitor. Nephrology following.  #BPH flomax  #Hypercalcemia continue supplements , WNL  # Overall poor prognosis palliative care consulted. Conversation at [employer] with [facility]CPOA today. Will continue to touch base.   DVT prophylaxis: Subcutaneous heparin  All the records are reviewed and case is discussed with Care Management/Social Worker. Management plans discussed with the patient and/or family and they are in agreement.  CODE STATUS: Full Code  TOTAL TIME TAKING CARE OF THIS PATIENT: 30 minutes.   More than 50% of the time was spent in counseling/coordination of care: YES  POSSIBLE D/C IN 2-3 DAYS, DEPENDING ON CLINICAL CONDITION.   Dale Pickelsimer PA-C on 12/07/2018 at 5:10 PM  Between 7am to 6pm - Pager - [phone]  After 6 pm go to [email or web address] - [employer] [facility]  Office  [phone]  CC: Primary care physician; Dale Fairly, MD  Note: This dictation was prepared with Dragon dictation along with smaller phrase technology. Any transcriptional errors that result from this process are unintentional.

## 2018-12-07 NOTE — Evaluation (Signed)
Physical Therapy Evaluation Patient Details Name: Dale Williams MRN: [id number] DOB: [birth year] Today's Date: 12/07/2018   History of Present Illness  presented to ER secondary to generalized weakness, cough, AMS; admitted for management of CAP.  Noted with mild elevation in troponin (0.24, trending flat) and 61mm subdural hygroma (no intervention recommend per neurosurgery)  Clinical Impression  Patient sleeping upon arrival to room, but awakens and participates with session as able.  Generally confused, oriented to self only; inconsistently follows simple commands, but often requires hand-over-hand to initiate and complete all functional activities.  UE > LEs mildly tremulous/jerking at times (at rest and with exertion).  Currently requiring mod assist for bed mobility; min/mod assist +2 for sit/stand and static stance.  Does require use of RW and +1 at all times for safety (tends to brace against edge of bed for balance).  Unable to tolerate additional gait or OOB due to orthostatic BP (see vitals flowsheet for results).  Will continue to assess/progress as medically appropriate in subsequent sessions. Would benefit from skilled PT to address above deficits and promote optimal return to PLOF; recommend transition to STR upon discharge from acute hospitalization.     Follow Up Recommendations SNF    Equipment Recommendations       Recommendations for Other Services       Precautions / Restrictions Precautions Precautions: Fall Restrictions Weight Bearing Restrictions: No      Mobility  Bed Mobility Overal bed mobility: Needs Assistance Bed Mobility: Supine to Sit     Supine to sit: Min assist;Mod assist     General bed mobility comments: hand-over-hand assist to initiate and guide movement  Transfers Overall transfer level: Needs assistance Equipment used: Rolling walker (2 wheeled) Transfers: Sit to/from Stand Sit to Stand: Min assist;Mod assist;+2 safety/equipment          General transfer comment: hand-over-hand to initiate; mildly tremulous, noted balance deficits  Ambulation/Gait             General Gait Details: deferred due to symptomatic orthostasis  Stairs            Wheelchair Mobility    Modified Rankin (Stroke Patients Only)       Balance Overall balance assessment: Needs assistance Sitting-balance support: No upper extremity supported;Feet supported Sitting balance-Leahy Scale: Fair     Standing balance support: Bilateral upper extremity supported Standing balance-Leahy Scale: Poor Standing balance comment: requires UE support and +1-2 assist for safety                             Pertinent Vitals/Pain Pain Assessment: Faces Faces Pain Scale: No hurt    Home Living Family/patient expects to be discharged to:: Private residence Living Arrangements: Other relatives Available Help at Discharge: Family;Available PRN/intermittently             Additional Comments: Patient questionable historian; will verify social history with family as available.    Prior Function Level of Independence: Needs assistance         Comments: Patient indicates some degree of ambulation, likely with RW; will verify additional functional information from family as available.     Hand Dominance        Extremity/Trunk Assessment   Upper Extremity Assessment Upper Extremity Assessment: Generalized weakness(grossly 3+ to 4-/5 throughout; intermittent jerking/tremors noted bilat UEs)    Lower Extremity Assessment Lower Extremity Assessment: Generalized weakness(grossly 3+ to 4-/5 throughout)       Communication  Communication: HOH  Cognition Arousal/Alertness: Awake/alert Behavior During Therapy: WFL for tasks assessed/performed Overall Cognitive Status: Within Functional Limits for tasks assessed                                        General Comments      Exercises Other  Exercises Other Exercises: Orthostatic assessment: supine BP 99/65, sitting BP 101/61, standing BP 12/43, standing x3 minutes BP 88/54, return to supine BP 96/65.  RN informed/aware.   Assessment/Plan    PT Assessment Patient needs continued PT services  PT Problem List Decreased strength;Decreased cognition;Decreased activity tolerance;Decreased balance;Decreased mobility;Decreased coordination;Decreased knowledge of use of DME;Decreased safety awareness;Cardiopulmonary status limiting activity       PT Treatment Interventions DME instruction;Gait training;Functional mobility training;Therapeutic activities;Therapeutic exercise;Balance training;Cognitive remediation;Patient/family education;Stair training    PT Goals (Current goals can be found in the Care Plan section)  Acute Rehab PT Goals PT Goal Formulation: Patient unable to participate in goal setting Time For Goal Achievement: 12/21/18 Potential to Achieve Goals: Fair    Frequency Min 2X/week   Barriers to discharge Decreased caregiver support      Co-evaluation               AM-PAC PT "6 Clicks" Mobility  Outcome Measure Help needed turning from your back to your side while in a flat bed without using bedrails?: A Lot Help needed moving from lying on your back to sitting on the side of a flat bed without using bedrails?: A Lot Help needed moving to and from a bed to a chair (including a wheelchair)?: A Lot Help needed standing up from a chair using your arms (e.g., wheelchair or bedside chair)?: Total Help needed to walk in hospital room?: Total Help needed climbing 3-5 steps with a railing? : Total 6 Click Score: 9    End of Session Equipment Utilized During Treatment: Gait belt Activity Tolerance: Patient tolerated treatment well;Treatment limited secondary to medical complications (Comment)(orthostatic hypotension) Patient left: in bed;with call bell/phone within reach;with bed alarm set Nurse Communication:  Mobility status PT Visit Diagnosis: Muscle weakness (generalized) (M62.81);Difficulty in walking, not elsewhere classified (R26.2)    Time: 6333-5456 PT Time Calculation (min) (ACUTE ONLY): 26 min   Charges:   PT Evaluation $PT Eval Moderate Complexity: 1 Mod PT Treatments $Therapeutic Activity: 8-22 mins        Dorinne Graeff H. Owens Shark, PT, DPT, NCS 12/07/18, 1:04 PM [phone]   Evaluation/treatment session was lead and completed by Dorothy Spark, SPT; directly observed, guided and documented by Tera Helper, PT.

## 2018-12-08 LAB — BASIC METABOLIC PANEL
Anion gap: 7 (ref 5–15)
BUN: 81 mg/dL — ABNORMAL HIGH (ref 8–23)
CHLORIDE: 104 mmol/L (ref 98–111)
CO2: 16 mmol/L — AB (ref 22–32)
Calcium: 9.4 mg/dL (ref 8.9–10.3)
Creatinine, Ser: 2.61 mg/dL — ABNORMAL HIGH (ref 0.61–1.24)
GFR calc Af Amer: 25 mL/min — ABNORMAL LOW (ref >60)
GFR calc non Af Amer: 21 mL/min — ABNORMAL LOW (ref >60)
GLUCOSE: 150 mg/dL — AB (ref 70–99)
Potassium: 5.2 mmol/L — ABNORMAL HIGH (ref 3.5–5.1)
Sodium: 127 mmol/L — ABNORMAL LOW (ref 135–145)

## 2018-12-08 LAB — CBC
HCT: 28.2 % — ABNORMAL LOW (ref 39.0–52.0)
Hemoglobin: 10.4 g/dL — ABNORMAL LOW (ref 13.0–17.0)
MCH: 28.8 pg (ref 26.0–34.0)
MCHC: 36.9 g/dL — ABNORMAL HIGH (ref 30.0–36.0)
MCV: 78.1 fL — AB (ref 80.0–100.0)
Platelets: 117 10*3/uL — ABNORMAL LOW (ref 150–400)
RBC: 3.61 MIL/uL — ABNORMAL LOW (ref 4.22–5.81)
RDW: 17.2 % — ABNORMAL HIGH (ref 11.5–15.5)
WBC: 4.3 10*3/uL (ref 4.0–10.5)
nRBC: 0.7 % — ABNORMAL HIGH (ref 0.0–0.2)

## 2018-12-08 LAB — RESPIRATORY PANEL BY PCR
Adenovirus: NOT DETECTED
Bordetella pertussis: NOT DETECTED
Chlamydophila pneumoniae: NOT DETECTED
Coronavirus 229E: NOT DETECTED
Coronavirus HKU1: NOT DETECTED
Coronavirus NL63: NOT DETECTED
Coronavirus OC43: NOT DETECTED
INFLUENZA A-RVPPCR: NOT DETECTED
INFLUENZA B-RVPPCR: NOT DETECTED
MYCOPLASMA PNEUMONIAE-RVPPCR: NOT DETECTED
Metapneumovirus: NOT DETECTED
Parainfluenza Virus 1: NOT DETECTED
Parainfluenza Virus 2: NOT DETECTED
Parainfluenza Virus 3: NOT DETECTED
Parainfluenza Virus 4: NOT DETECTED
Respiratory Syncytial Virus: NOT DETECTED
Rhinovirus / Enterovirus: NOT DETECTED

## 2018-12-08 LAB — RENAL FUNCTION PANEL
Albumin: 3 g/dL — ABNORMAL LOW (ref 3.5–5.0)
Anion gap: 9 (ref 5–15)
BUN: 87 mg/dL — ABNORMAL HIGH (ref 8–23)
CALCIUM: 9.6 mg/dL (ref 8.9–10.3)
CO2: 16 mmol/L — ABNORMAL LOW (ref 22–32)
Chloride: 103 mmol/L (ref 98–111)
Creatinine, Ser: 2.61 mg/dL — ABNORMAL HIGH (ref 0.61–1.24)
GFR calc Af Amer: 25 mL/min — ABNORMAL LOW (ref >60)
GFR calc non Af Amer: 21 mL/min — ABNORMAL LOW (ref >60)
Glucose, Bld: 74 mg/dL (ref 70–99)
Phosphorus: 3.7 mg/dL (ref 2.5–4.6)
Potassium: 5.4 mmol/L — ABNORMAL HIGH (ref 3.5–5.1)
Sodium: 128 mmol/L — ABNORMAL LOW (ref 135–145)

## 2018-12-08 LAB — GLUCOSE, CAPILLARY
Glucose-Capillary: 63 mg/dL — ABNORMAL LOW (ref 70–99)
Glucose-Capillary: 134 mg/dL — ABNORMAL HIGH (ref 70–99)

## 2018-12-08 LAB — PROCALCITONIN: Procalcitonin: 0.11 ng/mL

## 2018-12-08 MED ORDER — LEVOFLOXACIN 500 MG PO TABS
500.0000 mg | ORAL_TABLET | ORAL | Status: AC
  Administered 2018-12-08: 500 mg via ORAL
  Filled 2018-12-08: qty 1

## 2018-12-08 MED ORDER — SODIUM BICARBONATE 8.4 % IV SOLN
INTRAVENOUS | Status: AC
  Administered 2018-12-08 – 2018-12-09 (×2): via INTRAVENOUS
  Filled 2018-12-08 (×3): qty 150

## 2018-12-08 MED ORDER — LEVOFLOXACIN 500 MG PO TABS: 500.0000 mg | ORAL_TABLET | ORAL | Status: DC

## 2018-12-08 MED ORDER — CARVEDILOL 3.125 MG PO TABS: 3.1250 mg | ORAL_TABLET | Freq: Every day | ORAL | Status: DC
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
## 2018-12-08 NOTE — Progress Notes (Signed)
[facility]  ROUNDING NOTE   Subjective:   Patient pleasantly confused this morning.   Objective:  Vital signs in last 24 hours:  Temp:  [98.3 F (36.8 C)] 98.3 F (36.8 C) (03/04 1542) Pulse Rate:  [70-106] 106 (03/05 0605) Resp:  [10-19] 17 (03/04 1944) BP: (92-122)/(57-69) 94/58 (03/05 0605) SpO2:  [99 %-100 %] 100 % (03/05 0605) Weight:  [70 kg] 70 kg (03/05 0605)  Weight change: 0.282 kg Filed Weights   12/06/18 1841 12/07/18 0443 12/08/18 0605  Weight: 69.7 kg 69.4 kg 70 kg    Intake/Output: I/O last 3 completed shifts: In: 420 [P.O.:420] Out: 750 [Urine:750]   Intake/Output this shift:  No intake/output data recorded.  Physical Exam: General: NAD, laying in bed  Head: Normocephalic, atraumatic. Moist oral mucosal membranes  Eyes: Anicteric, PERRL  Neck: Supple, trachea midline  Lungs:  Clear to auscultation  Heart: Regular rate and rhythm  Abdomen:  Soft, nontender,   Extremities:  no peripheral edema.  Neurologic: Alert to self  Skin: No lesions        Basic Metabolic Panel: Recent Labs  Lab 12/05/18 2255 12/06/18 0615 12/07/18 0907 12/08/18 0420  NA 120* 121* 125* 128*  K 5.2* 5.4* 5.1 5.4*  CL 95* 98 102 103  CO2 15* 13* 14* 16*  GLUCOSE 76 73 90 74  BUN 114* 101* 93* 87*  CREATININE 3.37* 3.01* 2.95* 2.61*  CALCIUM 9.6 9.3 9.5 9.6  PHOS  --   --  4.8* 3.7    Liver Function Tests: Recent Labs  Lab 12/05/18 2255 12/06/18 0615 12/07/18 0907 12/08/18 0420  AST 119* 110*  --   --   ALT 42 33  --   --   ALKPHOS 60 51  --   --   BILITOT 0.5 0.8  --   --   PROT 7.4 6.8  --   --   ALBUMIN 3.7 3.0* 3.0* 3.0*   No results for input(s): LIPASE, AMYLASE in the last 168 hours. No results for input(s): AMMONIA in the last 168 hours.  CBC: Recent Labs  Lab 12/05/18 2134 12/06/18 0615 12/07/18 0907 12/08/18 0420  WBC 3.7* 3.5* 2.9* 4.3  NEUTROABS 2.6  --   --   --   HGB 10.7* 9.8* 10.1* 10.4*  HCT 29.2* 26.9* 27.3*  28.2*  MCV 78.5* 78.0* 78.9* 78.1*  PLT 134* 124* 122* 117*    Cardiac Enzymes: Recent Labs  Lab 12/05/18 2255 12/06/18 1033 12/06/18 1511 12/06/18 2056  TROPONINI 0.24* 0.24* 0.22* 0.22*    BNP: Invalid input(s): POCBNP  CBG: Recent Labs  Lab 12/06/18 1024 12/07/18 0822 12/07/18 1016  GLUCAP 72 18* 117*    Microbiology: Results for orders placed or performed during the hospital encounter of 12/05/18  Urine culture     Status: None   Collection Time: 12/05/18  9:34 PM  Result Value Ref Range Status   Specimen Description   Final    URINE, RANDOM Performed at [facility], [address] [place], [place] [address]    Special Requests   Final    NONE Performed at [facility], [address] [place], [place] [address]    Culture   Final    NO GROWTH Performed at [facility], [place] [address], [place], [place] [address]    Report Status 12/07/2018 FINAL  Final  Blood culture (routine x 2)     Status: None (Preliminary result)   Collection Time:  12/05/18 10:55 PM  Result Value Ref Range Status   Specimen Description BLOOD LEFT ANTECUBITAL  Final   Special Requests   Final    BOTTLES DRAWN AEROBIC AND ANAEROBIC Blood Culture adequate volume   Culture   Final    NO GROWTH 2 DAYS Performed at [facility], [address] [place], [place] [address]    Report Status PENDING  Incomplete  Blood culture (routine x 2)     Status: None (Preliminary result)   Collection Time: 12/05/18 10:55 PM  Result Value Ref Range Status   Specimen Description BLOOD BLOOD LEFT FOREARM  Final   Special Requests   Final    BOTTLES DRAWN AEROBIC AND ANAEROBIC Blood Culture adequate volume   Culture   Final    NO GROWTH 2 DAYS Performed at [facility], [address], [place], [place] [address]    Report Status PENDING  Incomplete    Coagulation Studies: Recent Labs    12/06/18 0615  LABPROT 14.1  INR 1.1     Urinalysis: Recent Labs    12/05/18 2134  COLORURINE YELLOW*  LABSPEC 1.011  PHURINE 5.0  GLUCOSEU NEGATIVE  HGBUR NEGATIVE  BILIRUBINUR NEGATIVE  KETONESUR NEGATIVE  PROTEINUR NEGATIVE  NITRITE NEGATIVE  LEUKOCYTESUR NEGATIVE      Imaging: No results found.   Medications:   . levofloxacin (LEVAQUIN) IV Stopped (12/08/18 0032)   . atorvastatin  20 mg Oral q1800  . calcitRIOL  0.5 mcg Oral Daily  . carvedilol  3.125 mg Oral BID WC  . guaiFENesin  600 mg Oral BID  . heparin  5,000 Units Subcutaneous Q12H  . liver oil-zinc oxide   Topical QID  . mouth rinse  15 mL Mouth Rinse BID  . sodium chloride flush  3 mL Intravenous Q12H  . sodium chloride flush  3 mL Intravenous Q12H  . tamsulosin  0.4 mg Oral Daily   acetaminophen **OR** acetaminophen, albuterol, ondansetron **OR** ondansetron (ZOFRAN) IV, sodium chloride flush  Assessment/ Plan:  Mr. Claris Guymon is a 85 y.o. black male  Mr. Khalee Mazo is a 85 y.o. black male with dementia, hypertension, prostate cancer, atrial fibrillation, congestive heart failure, colon cancer, who was admitted to [facility] on 12/05/2018 for SOB/AMS with HCAP  1. Acute renal failure 2. Chronic Kidney Disease stage IV baseline creatinine of 2.6, GFR of 25 on 10/09/2018 3. Hyponatremia 4. Hyperkalemia 5. Metabolic acidosis  6. Anemia with kidney failure 7. Urinary tract infection - on levofloxacin  Plan  Start bicarb gtt for 24 hours.    LOS: 2 Emma Schupp 3/5/20207:02 AM

## 2018-12-08 NOTE — NC FL2 (Signed)
[place] LEVEL OF CARE SCREENING TOOL     IDENTIFICATION  Patient Name: Dale Williams Birthdate: 1931/01/17 Sex: male Admission Date (Current Location): 12/05/2018  [place] and [place] Number:  Engineering geologist and Address:  [facility], [address], [place], [place] [address]      Provider Number: [id number]  Attending Physician Name and Address:  Max Sane, MD  Relative Name and Phone Number:  Larinda Buttery   [phone]     Current Level of Care: Hospital Recommended Level of Care: [place] Prior Approval Number:    Date Approved/Denied:   PASRR Number: [id number]  Discharge Plan: SNF    Current Diagnoses: Patient Active Problem List   Diagnosis Date Noted  . Pressure injury of skin 12/07/2018  . PNA (pneumonia) 12/06/2018    Orientation RESPIRATION BLADDER Height & Weight     Self, Place    Incontinent Weight: 154 lb 5.2 oz (70 kg) Height:  5\' 11"  (180.3 cm)  BEHAVIORAL SYMPTOMS/MOOD NEUROLOGICAL BOWEL NUTRITION STATUS      Continent Diet(Dysphagia 2 diet)  AMBULATORY STATUS COMMUNICATION OF NEEDS Skin   Limited Assist Verbally PU Stage and Appropriate Care   PU Stage 2 Dressing: (PRN dressing change)                   Personal Care Assistance Level of Assistance  Bathing, Dressing Bathing Assistance: Limited assistance   Dressing Assistance: Limited assistance     Functional Limitations Info  Sight, Hearing, Speech Sight Info: Adequate Hearing Info: Adequate Speech Info: Adequate    SPECIAL CARE FACTORS FREQUENCY  PT (By licensed PT), OT (By licensed OT)     PT Frequency: 5x a week OT Frequency: 5x a week            Contractures Contractures Info: Not present    Additional Factors Info  Code Status, Allergies Code Status Info: Full Code Allergies Info: PENICILLINS            Current Medications (12/08/2018):  This is the current hospital active  medication list Current Facility-Administered Medications  Medication Dose Route Frequency Provider Last Rate Last Dose  . acetaminophen (TYLENOL) tablet 650 mg  650 mg Oral Q6H PRN Sedalia Muta, MD       Or  . acetaminophen (TYLENOL) suppository 650 mg  650 mg Rectal Q6H PRN Sedalia Muta, MD      . albuterol (PROVENTIL) (2.5 MG/3ML) 0.083% nebulizer solution 2.5 mg  2.5 mg Nebulization Q2H PRN Sedalia Muta, MD      . atorvastatin (LIPITOR) tablet 20 mg  20 mg Oral q1800 Sedalia Muta, MD   20 mg at 12/08/18 1731  . calcitRIOL (ROCALTROL) capsule 0.5 mcg  0.5 mcg Oral Daily Sedalia Muta, MD   0.5 mcg at 12/08/18 1102  . [START ON 12/09/2018] carvedilol (COREG) tablet 3.125 mg  3.125 mg Oral Daily Manuella Ghazi, Vipul, MD      . guaiFENesin (MUCINEX) 12 hr tablet 600 mg  600 mg Oral BID Sedalia Muta, MD   600 mg at 12/08/18 1103  . [START ON 12/09/2018] levofloxacin (LEVAQUIN) tablet 500 mg  500 mg Oral Q48H Shah, Vipul, MD      . liver oil-zinc oxide (DESITIN) 40 % ointment   Topical QID Max Sane, MD      . MEDLINE mouth rinse  15 mL Mouth Rinse BID Max Sane, MD   15 mL at 12/07/18 2109  . ondansetron (ZOFRAN) tablet 4  mg  4 mg Oral Q6H PRN Sedalia Muta, MD       Or  . ondansetron [facility]) injection 4 mg  4 mg Intravenous Q6H PRN Sedalia Muta, MD      . sodium bicarbonate 150 mEq in dextrose 5 % 1,000 mL infusion   Intravenous Continuous Kolluru, Sarath, MD 75 mL/hr at 12/08/18 1101    . sodium chloride flush (NS) 0.9 % injection 3 mL  3 mL Intravenous Q12H Sedalia Muta, MD   3 mL at 12/07/18 2110  . sodium chloride flush (NS) 0.9 % injection 3 mL  3 mL Intravenous Q12H Manuella Ghazi, Vipul, MD   3 mL at 12/08/18 1102  . sodium chloride flush (NS) 0.9 % injection 3 mL  3 mL Intravenous PRN Max Sane, MD      . tamsulosin (FLOMAX) capsule 0.4 mg  0.4 mg Oral Daily Sedalia Muta, MD   0.4 mg at 12/08/18 1102     Discharge Medications: Please see discharge summary for a list of  discharge medications.  Relevant Imaging Results:  Relevant Lab Results:   Additional Information SSN [id number]  Ross Ludwig, LCSW
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
## 2018-12-08 NOTE — Progress Notes (Signed)
Peri care provided, desitin applied to bottom, new sacral foam applied. Right forearm cleansed and new dressing applied.

## 2018-12-08 NOTE — Progress Notes (Signed)
This patient is receiving the antibiotic(s) Levaquin by the intravenous route.   Based on criteria approved by the Pharmacy and Therapeutics Committee, and the Infectious Disease Division, the antibiotic(s) is / are being converted to equivalent oral dose form(s). These criteria include:  . Patient being treated for a respiratory tract infection, urinary tract infection, cellulitis, or Clostridium Difficile Associated Diarrhea . The patient is not neutropenic and does not exhibit a GI malabsorption state . The patient is eating (either orally or per tube) and/or has been taking other orally administered medications for at least 24 hours. . The patient is improving clinically (physician assessment and a 24-hour Tmax of ? 100.5? F).  If you have questions about this conversion, please contact the pharmacy department.  Thank you,  Lu Duffel, PharmD, BCPS Clinical Pharmacist 12/08/2018 11:36 AM

## 2018-12-08 NOTE — Progress Notes (Addendum)
  Speech Language Pathology Treatment: Dysphagia  Patient Details Name: Dale Williams MRN: [id number] DOB: 06-Nov-1930 Today's Date: 12/08/2018 Time: 6945-0388 SLP Time Calculation (min) (ACUTE ONLY): 50 min  Assessment / Plan / Recommendation Clinical Impression  Pt seen for ongoing assessment of toleration of diet and trials to upgrade diet. Pt awake, verbally responsive to basic questions indicating he liked "ice cream" and graham crackers. He helped slightly to position more upright in bed. Pt does have a baseline of Dementia and requires verbal cues for follow through w/ tasks.  Pt consumed trials of semi-solid foods and thin liquids Via Cup w/ SLP w/ no immediate, overt s/s of aspiration noted; no decline in respiratory status or vocal quality when assessed. Oral phase appeared grossly [facility] for bolus management and mastication of semi-solids; oral clearing was achieved in adequate time. Pt has native dentition. Pt helped to feed self w/ support but noted shaky UEs, somewhat stiff. NSG reported having to help feed pt this morning at breakfast meal.  Recommend diet upgrade to a mech soft diet (meats cut) w/ thin liquids; general aspiration precautions; Pills in PUREE for safer swallowing. Pt will need monitoring and supervision at meals to assist w/ feeding when necessary d/t pt's overall weakness and baseline Dementia. ST services will be available for further need if any decline in status while admitted. NSG updated.     HPI HPI: CXR: Patchy bibasilar opacities, left greater than right. This may reflect atelectasis, pneumonia. Progressive cardiomegaly.  Pt is a86 y.o.malewith a known history of Alzheimer's Dementia and renal disease brought by family member for evaluation of generalized weakness, cough and concerns for UTI. Family also noticed patient has change in mental status. Patient has previous history of pneumonia and UTI. Patient gets similar changes with infection. Patient is not able  to provide history secondary to dementia. As per nephew patient is feeling weakness for last 1 to 2 weeks. Patient was recently diagnosed with an UTI and finished antibiotic treatment. Patient is still weak and not feeling well. In emergency room patient underwent initial evaluation that suggestive of pneumonia. CT head showed hygroma. ER doctor has discussed case with neurosurgeon who recommended no further intervention or evaluation.      SLP Plan  Continue with current plan of care       Recommendations  Diet recommendations: Dysphagia 3 (mechanical soft);Thin liquid Liquids provided via: Cup;No straw Medication Administration: Whole meds with puree(for safer swallowing) Supervision: Patient able to self feed;Staff to assist with self feeding;Intermittent supervision to cue for compensatory strategies Compensations: Minimize environmental distractions;Slow rate;Small sips/bites;Follow solids with liquid;Lingual sweep for clearance of pocketing Postural Changes and/or Swallow Maneuvers: Seated upright 90 degrees;Upright 30-60 min after meal                General recommendations: (dietician f/u as needed) Oral Care Recommendations: Oral care BID;Staff/trained caregiver to provide oral care Follow up Recommendations: None(TBD) SLP Visit Diagnosis: Dysphagia, unspecified (R13.10) Plan: Continue with current plan of care       [place], [place], CCC-SLP Josaphine Shimamoto 12/08/2018, 2:27 PM

## 2018-12-09 ENCOUNTER — Inpatient Hospital Stay: Payer: Medicare HMO

## 2018-12-09 DIAGNOSIS — I4821 Permanent atrial fibrillation: Secondary | ICD-10-CM

## 2018-12-09 LAB — HEPATITIS PANEL, ACUTE
HCV Ab: <0.1 s/co ratio (ref 0.0–0.9)
HEP B S AG: NEGATIVE
Hep A IgM: NEGATIVE
Hep B C IgM: NEGATIVE

## 2018-12-09 LAB — GLUCOSE, CAPILLARY: Glucose-Capillary: 92 mg/dL (ref 70–99)

## 2018-12-09 LAB — RENAL FUNCTION PANEL
ALBUMIN: 2.9 g/dL — AB (ref 3.5–5.0)
Anion gap: 7 (ref 5–15)
BUN: 75 mg/dL — AB (ref 8–23)
CO2: 20 mmol/L — ABNORMAL LOW (ref 22–32)
Calcium: 9.7 mg/dL (ref 8.9–10.3)
Chloride: 104 mmol/L (ref 98–111)
Creatinine, Ser: 2.39 mg/dL — ABNORMAL HIGH (ref 0.61–1.24)
GFR calc Af Amer: 28 mL/min — ABNORMAL LOW (ref >60)
GFR, EST NON AFRICAN AMERICAN: 24 mL/min — AB (ref >60)
Glucose, Bld: 99 mg/dL (ref 70–99)
Phosphorus: 2.9 mg/dL (ref 2.5–4.6)
Potassium: 5.3 mmol/L — ABNORMAL HIGH (ref 3.5–5.1)
Sodium: 131 mmol/L — ABNORMAL LOW (ref 135–145)

## 2018-12-09 LAB — CBC
HCT: 28.3 % — ABNORMAL LOW (ref 39.0–52.0)
Hemoglobin: 10.3 g/dL — ABNORMAL LOW (ref 13.0–17.0)
MCH: 28.4 pg (ref 26.0–34.0)
MCHC: 36.4 g/dL — AB (ref 30.0–36.0)
MCV: 78 fL — ABNORMAL LOW (ref 80.0–100.0)
Platelets: 109 10*3/uL — ABNORMAL LOW (ref 150–400)
RBC: 3.63 MIL/uL — ABNORMAL LOW (ref 4.22–5.81)
RDW: 17.1 % — ABNORMAL HIGH (ref 11.5–15.5)
WBC: 5.4 10*3/uL (ref 4.0–10.5)
nRBC: 0.7 % — ABNORMAL HIGH (ref 0.0–0.2)

## 2018-12-09 LAB — PROCALCITONIN: Procalcitonin: 0.12 ng/mL

## 2018-12-09 MED ORDER — SODIUM POLYSTYRENE SULFONATE 15 GM/60ML PO SUSP
30.0000 g | Freq: Once | ORAL | Status: AC
  Administered 2018-12-09: 30 g via ORAL
  Filled 2018-12-09: qty 120

## 2018-12-09 MED ORDER — CARVEDILOL 6.25 MG PO TABS: 6.2500 mg | ORAL_TABLET | Freq: Two times a day (BID) | ORAL | 0 refills | Status: AC

## 2018-12-09 MED ORDER — CARVEDILOL 3.125 MG PO TABS
3.1250 mg | ORAL_TABLET | Freq: Once | ORAL | Status: AC
  Administered 2018-12-09: 3.125 mg via ORAL
  Filled 2018-12-09: qty 1

## 2018-12-09 MED ORDER — CARVEDILOL 3.125 MG PO TABS
3.1250 mg | ORAL_TABLET | Freq: Two times a day (BID) | ORAL | Status: DC
  Administered 2018-12-09: 3.125 mg via ORAL
  Filled 2018-12-09: qty 1

## 2018-12-09 MED ORDER — CARVEDILOL 6.25 MG PO TABS
6.2500 mg | ORAL_TABLET | Freq: Two times a day (BID) | ORAL | Status: DC
  Administered 2018-12-09: 6.25 mg via ORAL
  Filled 2018-12-09: qty 1

## 2018-12-09 NOTE — Progress Notes (Signed)
Report given to [facility] at [facility]. EMS called for transport. IV taken out and tele monitor off.

## 2018-12-09 NOTE — Progress Notes (Signed)
New referral for outpatient Palliative to follow at [facility] care received form CSW Evette Cristal. Patient to discharge today. Patient information faxed to referral.  Flo Shanks BSN, RN, [facility] Liaison [facility] [place] [phone]

## 2018-12-09 NOTE — Clinical Social Work Note (Signed)
PT is recommending SNF placement, patient is agreeable, CSW faxed requested clinicals to insurance company awaiting insurance authorization.  Jones Broom. Aundray Cartlidge, MSW, LCSW [phone]  12/09/2018 10:00 AM

## 2018-12-09 NOTE — Progress Notes (Signed)
Progress Note  Patient Name: Dale Williams Date of Encounter: 12/09/2018  Primary Cardiologist: New - consult by Zavian Slowey  Subjective   No chest pain or shortness of breath.  Patient notes palpitations today.  Inpatient Medications    Scheduled Meds: . atorvastatin  20 mg Oral q1800  . calcitRIOL  0.5 mcg Oral Daily  . carvedilol  3.125 mg Oral BID WC  . guaiFENesin  600 mg Oral BID  . liver oil-zinc oxide   Topical QID  . mouth rinse  15 mL Mouth Rinse BID  . sodium chloride flush  3 mL Intravenous Q12H  . sodium chloride flush  3 mL Intravenous Q12H  . tamsulosin  0.4 mg Oral Daily   Continuous Infusions:  PRN Meds: acetaminophen **OR** acetaminophen, albuterol, ondansetron **OR** ondansetron (ZOFRAN) IV, sodium chloride flush   Vital Signs    Vitals:   12/09/18 0748 12/09/18 0755 12/09/18 1021 12/09/18 1024  BP: 123/89  (!) 134/104 (!) 134/104  Pulse: (!) 105  94 (!) 109  Resp:  18    Temp: 97.7 F (36.5 C)     TempSrc: Oral     SpO2: 100%     Weight:      Height:        Intake/Output Summary (Last 24 hours) at 12/09/2018 1138 Last data filed at 12/09/2018 1025 Gross per 24 hour  Intake 1220.62 ml  Output 540 ml  Net 680.62 ml   Last 3 Weights 12/09/2018 12/08/2018 12/07/2018  Weight (lbs) 138 lb 9.6 oz 154 lb 5.2 oz 153 lb  Weight (kg) 62.869 kg 70 kg 69.4 kg      Telemetry    Atrial fibrillation with ventricular rates of 7110 bpm.  Occasional wide-complex beats may represent aberrancy or PVCs/brief NSVT.- Personally Reviewed  ECG    No new tracing  Physical Exam   GEN: No acute distress.   Neck:  BP approximately 8 cm. Cardiac:  Irregularly irregular without murmurs. Respiratory: Scattered wheezes and rhonchi.  No crackles.  Fair air movement. GI: Soft, nontender, non-distended  MS: No edema; No deformity. Neuro:  Nonfocal  Psych: Normal affect   Labs    Chemistry Recent Labs  Lab 12/05/18 2255 12/06/18 0615 12/07/18 0907 12/08/18 0420  12/08/18 1147 12/09/18 0417  NA 120* 121* 125* 128* 127* 131*  K 5.2* 5.4* 5.1 5.4* 5.2* 5.3*  CL 95* 98 102 103 104 104  CO2 15* 13* 14* 16* 16* 20*  GLUCOSE 76 73 90 74 150* 99  BUN 114* 101* 93* 87* 81* 75*  CREATININE 3.37* 3.01* 2.95* 2.61* 2.61* 2.39*  CALCIUM 9.6 9.3 9.5 9.6 9.4 9.7  PROT 7.4 6.8  --   --   --   --   ALBUMIN 3.7 3.0* 3.0* 3.0*  --  2.9*  AST 119* 110*  --   --   --   --   ALT 42 33  --   --   --   --   ALKPHOS 60 51  --   --   --   --   BILITOT 0.5 0.8  --   --   --   --   GFRNONAA 16* 18* 18* 21* 21* 24*  GFRAA 18* 21* 21* 25* 25* 28*  ANIONGAP 10 10 9 9 7 7      Hematology Recent Labs  Lab 12/07/18 0907 12/08/18 0420 12/09/18 0417  WBC 2.9* 4.3 5.4  RBC 3.46* 3.61* 3.63*  HGB 10.1* 10.4* 10.3*  HCT 27.3* 28.2* 28.3*  MCV 78.9* 78.1* 78.0*  MCH 29.2 28.8 28.4  MCHC 37.0* 36.9* 36.4*  RDW 17.5* 17.2* 17.1*  PLT 122* 117* 109*    Cardiac Enzymes Recent Labs  Lab 12/05/18 2255 12/06/18 1033 12/06/18 1511 12/06/18 2056  TROPONINI 0.24* 0.24* 0.22* 0.22*   No results for input(s): TROPIPOC in the last 168 hours.   BNP Recent Labs  Lab 12/05/18 2134  BNP 1,473.0*     DDimer No results for input(s): DDIMER in the last 168 hours.   Radiology    US Abdomen Limited Ruq  Result Date: 12/09/2018 CLINICAL DATA:  Pain EXAM: ULTRASOUND ABDOMEN LIMITED RIGHT UPPER QUADRANT COMPARISON:  None. FINDINGS: Gallbladder: Few small gallstones within the gallbladder, the largest 5 mm. No wall thickening. Negative sonographic Murphy's. Common bile duct: Diameter: Normal caliber, 4 mm Liver: Increased echotexture compatible with fatty infiltration. No focal abnormality or biliary ductal dilatation. Portal vein is patent on color Doppler imaging with normal direction of blood flow towards the liver. Small amount of ascites noted adjacent to the liver. IMPRESSION: Cholelithiasis.  No sonographic evidence of acute cholecystitis. Mild fatty infiltration of the  liver. Small amount of perihepatic ascites. Electronically Signed   By: Rolm Baptise M.D.   On: 12/09/2018 09:43    Cardiac Studies   Echo (12/06/2018):  1. The left ventricle has moderately reduced systolic function, with an ejection fraction of 35-40%. The cavity size was normal. There is mildly increased left ventricular wall thickness. Left ventricular diastology could not be evaluated secondary to  atrial fibrillation.  2. The right ventricle has mildly reduced systolic function. The cavity was normal. There is no increase in right ventricular wall thickness. Right ventricular systolic pressure is moderately elevated.  3. Left atrial size was mildly dilated.  4. Right atrial size was mildly dilated.  5. Trivial pericardial effusion is present.  6. The mitral valve is degenerative. Moderate thickening of the mitral valve leaflet. There is moderate mitral annular calcification present.  7. The tricuspid valve is not well visualized. Tricuspid valve regurgitation is mild-moderate.  8. The aortic valve was not well visualized Moderate thickening of the aortic valve Moderate calcification of the aortic valve.  9. The aortic root is normal in size and structure. 10. The interatrial septum was not well visualized.  Patient Profile     85 y.o. male with history of HFrEF, persistent A. fib not on oral anticoagulation, pulmonary hypertension, Alzheimer's dementia, colon cancer status post surgical resection, CKD stage III secondary to hypertension, anemia of chronic disease, frequent falls, and frequent UTIs with history of pneumonia, admitted with increased lethargy per family and found to have mild troponin elevation.  Assessment & Plan    Elevated troponin Not consistent with ACS.  Most likely represents supply-demand mismatch in the setting of atrial fibrillation and chronic kidney disease.  No further work-up at this time given multiple comorbidities.  Consider adding aspirin 81 mg daily if  patient does not have falls in the future.  Persistent atrial fibrillation Heart rate somewhat elevated this morning.  Wide-complex beats could represent aberrancy versus brief NSVT.  Recommend increasing carvedilol to 6.25 mg twice daily.  No further intervention is recommended.  Anticoagulation continues to be deferred given advanced dementia and history of falls.  Acute on chronic HFrEF Patient appears grossly euvolemic and is without heart failure symptoms.  Meant increasing carvedilol to 6.25 mg twice daily, as above, and maintenance of net even fluid balance.  Disposition From  a cardiac standpoint, it is reasonable to discharge Mr. [place].  He receives most of his care through [facility] and was previously evaluated by Dr. Charletta Cousin, cardiologist at [facility].  I suggest that Mr. Blaze follow-up with him, though if it is more convenient to be seen in [place], he can follow-up in our office in approximately 2 weeks.  [facility] will sign off.   Medication Recommendations:  Carvedilol 6.25 mg BID Other recommendations (labs, testing, etc):  None Follow up as an outpatient:  1-2 weeks with Kandis Cocking, MD [facility]) or in our office if more convenient for the patient.  For questions or updates, please contact [place] Please consult [email or web address] for contact info under [facility]iology.  Signed, Nelva Bush, MD  12/09/2018, 11:38 AM

## 2018-12-09 NOTE — Plan of Care (Signed)

## 2018-12-09 NOTE — Discharge Instructions (Signed)
Wound type:There is a partial thickness loss of skin at the very top of the gluteal fold that measures approximately 1 cm x 0.5 cm and over at the top of the right buttock that measures approximately 2 cm x 3 cm. Neither of these have measureable depth, but the top layer of epithelial tissue is definitely gone. Both wound beds are pink and clean, without odor, drainage, or induration. I am not sure they are pressure related. They may be more related to friction, shear, and MASD from incontinence." Desitin 40%, apply to the coccyx and buttocks4 times daily. Apply to the right forearm once daily and cover with a Vaseline gauze and wrap with kerlex.Additional care measures include: a low air loss mattress; bilateral prevalon boots, turn the patient every 2 hours,Use ONLY ONE DermaTherapy pad beneath patient's buttocks. Do NOT use disposable pads. Monitor the wound area(s) for worsening of condition such as: Signs/symptoms of infection,  Increase in size,  Development of or worsening of odor, Development of pain, or increased pain at the affected locations. Notify the medical team if any of these develop.

## 2018-12-09 NOTE — Clinical Social Work Placement (Signed)
   CLINICAL SOCIAL WORK PLACEMENT  NOTE  Date:  12/09/2018  Patient Details  Name: Dale Williams MRN: [id number] Date of Birth: [birth year]  Clinical Social Work is seeking post-discharge placement for this patient at the [place] level of care (*CSW will initial, date and re-position this form in  chart as items are completed):  Yes   Patient/family provided with [place] Work Department's list of facilities offering this level of care within the geographic area requested by the patient (or if unable, by the patient's family).  Yes   Patient/family informed of their freedom to choose among providers that offer the needed level of care, that participate in Medicare, Medicaid or managed care program needed by the patient, have an available bed and are willing to accept the patient.  Yes   Patient/family informed of [facility]'s ownership interest in [facility] and [facility], as well as of the fact that they are under no obligation to receive care at these facilities.  PASRR submitted to EDS on 12/08/18     PASRR number received on       Existing PASRR number confirmed on 12/08/18     FL2 transmitted to all facilities in geographic area requested by pt/family on 12/08/18     FL2 transmitted to all facilities within larger geographic area on       Patient informed that his/her managed care company has contracts with or will negotiate with certain facilities, including the following:        Yes   Patient/family informed of bed offers received.  Patient chooses bed at [facility]     Physician recommends and patient chooses bed at      Patient to be transferred to [facility] on 12/09/18.  Patient to be transferred to facility by [facility]     Patient family notified on 12/09/18 of transfer.  Name of family member notified:  Patient's nephew Winferd Humphrey and patient's brother Lennox Grumbles     PHYSICIAN Please  prepare prescriptions, Please sign FL2, Please prepare priority discharge summary, including medications     Additional Comment:    _______________________________________________ Ross Ludwig, LCSW 12/09/2018, 6:55 PM

## 2018-12-09 NOTE — Clinical Social Work Note (Addendum)
CSW received phone call from [employer], he has been approved for SNF placement.  Patient's insurance reference number is [phone], CSW updated physician and patient's brother who was at bedside.  CSW contacted [facility] and they can accept patient today.    Patient to be d/c'ed today to [facility] room 42B.  Patient and family agreeable to plans will transport via ems RN to call report [phone].  Palliative recommending patient to be followed at [facility], CSW made referral to [facility] Nurse Liaison.   Patient's brother Chesley Mires was at bedside and is aware that patient is discharging today.  CSW attempted to contact patient's nephew  Winferd Humphrey and CSW had to leave a message.  CSW updated APS worker [place] Hairston [phone] [phone] that patient will be discharging today.  APS will continue to follow patient at [place], [facility], was given APS contact information.  APS reports they have also submitted long term care Medicaid application for patient, because he will most likely need SNF as a long term care resident.  CSW informed APS, that insurance will only pay for patient as long as he is working with therapy and making progress.  APS expressed understanding.   6:39pm  CSW received phone call from patient's nephew, Winferd Humphrey CSW informed him that patient is discharging to [facility] and insurance has approved patient for short term rehab.  Jones Broom. Norval Morton, MSW, [place]  12/09/2018 4:27 PM
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
## 2018-12-09 NOTE — Care Management Important Message (Signed)
Copy of signed Medicare IM left with patient in room. 
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
## 2018-12-09 NOTE — Progress Notes (Signed)
[facility]  ROUNDING NOTE   Subjective:  Patient sleeping but is arousable. His power of attorney is at the bedside. BUN currently 75 with a creatinine of 2.39 and EGFR of 28.   Objective:  Vital signs in last 24 hours:  Temp:  [97.5 F (36.4 C)-97.7 F (36.5 C)] 97.7 F (36.5 C) (03/06 0748) Pulse Rate:  [77-109] 103 (03/06 1203) Resp:  [18-20] 18 (03/06 0755) BP: (104-134)/(62-104) 134/104 (03/06 1024) SpO2:  [98 %-100 %] 100 % (03/06 0748) Weight:  [62.9 kg] 62.9 kg (03/06 0346)  Weight change: -7.131 kg Filed Weights   12/07/18 0443 12/08/18 0605 12/09/18 0346  Weight: 69.4 kg 70 kg 62.9 kg    Intake/Output: I/O last 3 completed shifts: In: 1637.6 [P.O.:1370; I.V.:267.6] Out: 1790 [OITGP:4982]   Intake/Output this shift:  Total I/O In: 3 [I.V.:3] Out: 700 [Urine:700]  Physical Exam: General: NAD, laying in bed  Head: Normocephalic, atraumatic. Moist oral mucosal membranes  Eyes: Anicteric  Neck: Supple, trachea midline  Lungs:  Clear to auscultation  Heart: Regular rate and rhythm  Abdomen:  Soft, nontender,   Extremities: no peripheral edema.  Neurologic: Lethargic but arousable  Skin: No lesions        Basic Metabolic Panel: Recent Labs  Lab 12/06/18 0615 12/07/18 0907 12/08/18 0420 12/08/18 1147 12/09/18 0417  NA 121* 125* 128* 127* 131*  K 5.4* 5.1 5.4* 5.2* 5.3*  CL 98 102 103 104 104  CO2 13* 14* 16* 16* 20*  GLUCOSE 73 90 74 150* 99  BUN 101* 93* 87* 81* 75*  CREATININE 3.01* 2.95* 2.61* 2.61* 2.39*  CALCIUM 9.3 9.5 9.6 9.4 9.7  PHOS  --  4.8* 3.7  --  2.9    Liver Function Tests: Recent Labs  Lab 12/05/18 2255 12/06/18 0615 12/07/18 0907 12/08/18 0420 12/09/18 0417  AST 119* 110*  --   --   --   ALT 42 33  --   --   --   ALKPHOS 60 51  --   --   --   BILITOT 0.5 0.8  --   --   --   PROT 7.4 6.8  --   --   --   ALBUMIN 3.7 3.0* 3.0* 3.0* 2.9*   No results for input(s): LIPASE, AMYLASE in the last 168  hours. No results for input(s): AMMONIA in the last 168 hours.  CBC: Recent Labs  Lab 12/05/18 2134 12/06/18 0615 12/07/18 0907 12/08/18 0420 12/09/18 0417  WBC 3.7* 3.5* 2.9* 4.3 5.4  NEUTROABS 2.6  --   --   --   --   HGB 10.7* 9.8* 10.1* 10.4* 10.3*  HCT 29.2* 26.9* 27.3* 28.2* 28.3*  MCV 78.5* 78.0* 78.9* 78.1* 78.0*  PLT 134* 124* 122* 117* 109*    Cardiac Enzymes: Recent Labs  Lab 12/05/18 2255 12/06/18 1033 12/06/18 1511 12/06/18 2056  TROPONINI 0.24* 0.24* 0.22* 0.22*    BNP: Invalid input(s): POCBNP  CBG: Recent Labs  Lab 12/07/18 0822 12/07/18 1016 12/08/18 0812 12/08/18 1653 12/09/18 0748  GLUCAP 52* 117* 63* 134* 27    Microbiology: Results for orders placed or performed during the hospital encounter of 12/05/18  Urine culture     Status: None   Collection Time: 12/05/18  9:34 PM  Result Value Ref Range Status   Specimen Description   Final    URINE, RANDOM Performed at [facility], [address] [place], [place] [address]    Special Requests  Final    NONE Performed at [facility], [address], [place], [place] [address]    Culture   Final    NO GROWTH Performed at [facility], [place] [address], [place], [place] [address]    Report Status 12/07/2018 FINAL  Final  Blood culture (routine x 2)     Status: None (Preliminary result)   Collection Time: 12/05/18 10:55 PM  Result Value Ref Range Status   Specimen Description BLOOD LEFT ANTECUBITAL  Final   Special Requests   Final    BOTTLES DRAWN AEROBIC AND ANAEROBIC Blood Culture adequate volume   Culture   Final    NO GROWTH 4 DAYS Performed at [facility], [address] [place], [place] [address]    Report Status PENDING  Incomplete  Blood culture (routine x 2)     Status: None (Preliminary result)   Collection Time: 12/05/18 10:55 PM  Result Value Ref Range Status   Specimen Description BLOOD BLOOD LEFT FOREARM  Final   Special  Requests   Final    BOTTLES DRAWN AEROBIC AND ANAEROBIC Blood Culture adequate volume   Culture   Final    NO GROWTH 4 DAYS Performed at [facility], [address], [place], [place] [address]    Report Status PENDING  Incomplete  Respiratory Panel by PCR     Status: None   Collection Time: 12/07/18  7:03 PM  Result Value Ref Range Status   Adenovirus NOT DETECTED NOT DETECTED Final   Coronavirus 229E NOT DETECTED NOT DETECTED Final    Comment: (NOTE) The Coronavirus on the Respiratory Panel, DOES NOT test for the novel  Coronavirus (2019 nCoV)    Coronavirus HKU1 NOT DETECTED NOT DETECTED Final   Coronavirus NL63 NOT DETECTED NOT DETECTED Final   Coronavirus OC43 NOT DETECTED NOT DETECTED Final   Metapneumovirus NOT DETECTED NOT DETECTED Final   Rhinovirus / Enterovirus NOT DETECTED NOT DETECTED Final   Influenza A NOT DETECTED NOT DETECTED Final   Influenza B NOT DETECTED NOT DETECTED Final   Parainfluenza Virus 1 NOT DETECTED NOT DETECTED Final   Parainfluenza Virus 2 NOT DETECTED NOT DETECTED Final   Parainfluenza Virus 3 NOT DETECTED NOT DETECTED Final   Parainfluenza Virus 4 NOT DETECTED NOT DETECTED Final   Respiratory Syncytial Virus NOT DETECTED NOT DETECTED Final   Bordetella pertussis NOT DETECTED NOT DETECTED Final   Chlamydophila pneumoniae NOT DETECTED NOT DETECTED Final   Mycoplasma pneumoniae NOT DETECTED NOT DETECTED Final    Comment: Performed at [facility], [place] [address] [place],  [address]    Coagulation Studies: No results for input(s): LABPROT, INR in the last 72 hours.  Urinalysis: No results for input(s): COLORURINE, LABSPEC, PHURINE, GLUCOSEU, HGBUR, BILIRUBINUR, KETONESUR, PROTEINUR, UROBILINOGEN, NITRITE, LEUKOCYTESUR in the last 72 hours.  Invalid input(s): APPERANCEUR    Imaging: US Abdomen Limited Ruq  Result Date: 12/09/2018 CLINICAL DATA:  Pain EXAM: ULTRASOUND ABDOMEN LIMITED RIGHT UPPER QUADRANT COMPARISON:   None. FINDINGS: Gallbladder: Few small gallstones within the gallbladder, the largest 5 mm. No wall thickening. Negative sonographic Murphy's. Common bile duct: Diameter: Normal caliber, 4 mm Liver: Increased echotexture compatible with fatty infiltration. No focal abnormality or biliary ductal dilatation. Portal vein is patent on color Doppler imaging with normal direction of blood flow towards the liver. Small amount of ascites noted adjacent to the liver. IMPRESSION: Cholelithiasis.  No sonographic evidence of acute cholecystitis. Mild fatty infiltration of the liver.  Small amount of perihepatic ascites. Electronically Signed   By: Rolm Baptise M.D.   On: 12/09/2018 09:43     Medications:    . atorvastatin  20 mg Oral q1800  . calcitRIOL  0.5 mcg Oral Daily  . carvedilol  6.25 mg Oral BID WC  . guaiFENesin  600 mg Oral BID  . liver oil-zinc oxide   Topical QID  . mouth rinse  15 mL Mouth Rinse BID  . sodium chloride flush  3 mL Intravenous Q12H  . sodium chloride flush  3 mL Intravenous Q12H  . tamsulosin  0.4 mg Oral Daily   acetaminophen **OR** acetaminophen, albuterol, ondansetron **OR** ondansetron (ZOFRAN) IV, sodium chloride flush  Assessment/ Plan:  Mr. Claudell Wohler is a 85 y.o. black male  Mr. Devrin Monforte is a 85 y.o. black male with dementia, hypertension, prostate cancer, atrial fibrillation, congestive heart failure, colon cancer, who was admitted to [facility] on 12/05/2018 for SOB/AMS with HCAP  1. Acute renal failure 2. Chronic Kidney Disease stage IV baseline creatinine of 2.6, GFR of 25 on 10/09/2018 3. Hyponatremia 4. Hyperkalemia 5. Metabolic acidosis  6. Anemia with kidney failure 7. Urinary tract infection - on levofloxacin  Plan  The patient's renal function has improved.  BUN currently 75 with a creatinine of 2.39.  EGFR up to 28.  Renal function better than his baseline now.  Avoid nephrotoxins and contrast as possible.  Further plan as patient progresses.    LOS: 3 Vanita Cannell 3/6/20203:24 PM

## 2018-12-09 NOTE — Progress Notes (Signed)
Patient having short runs of V-Tach with 4-8 beats runs noted.  Patient awake resting in bed and watching TV.  Denies any complaints or symptoms.  Dr. Marcille Blanco called and notified.about these runs.   Orders noted and will continue to monitor patient closely.

## 2018-12-10 LAB — CULTURE, BLOOD (ROUTINE X 2)
Culture: NO GROWTH
Culture: NO GROWTH
SPECIAL REQUESTS: ADEQUATE
Special Requests: ADEQUATE

## 2018-12-30 ENCOUNTER — Encounter: Payer: Self-pay | Admitting: Nurse Practitioner

## 2018-12-30 ENCOUNTER — Other Ambulatory Visit: Payer: Self-pay

## 2018-12-30 ENCOUNTER — Non-Acute Institutional Stay: Payer: Medicare HMO | Admitting: Nurse Practitioner

## 2018-12-30 VITALS — BP 112/60 | HR 83 | Temp 96.5°F | Resp 18 | Ht 71.0 in | Wt 154.0 lb

## 2018-12-30 DIAGNOSIS — Z515 Encounter for palliative care: Secondary | ICD-10-CM

## 2018-12-30 DIAGNOSIS — R1310 Dysphagia, unspecified: Secondary | ICD-10-CM | POA: Insufficient documentation

## 2018-12-30 DIAGNOSIS — R413 Other amnesia: Secondary | ICD-10-CM | POA: Insufficient documentation
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
## 2019-05-05 ENCOUNTER — Non-Acute Institutional Stay: Payer: Medicare HMO | Admitting: Nurse Practitioner

## 2019-05-05 ENCOUNTER — Encounter: Payer: Self-pay | Admitting: Nurse Practitioner

## 2019-05-05 VITALS — BP 124/78 | HR 68 | Temp 98.2°F | Resp 20 | Ht 71.0 in | Wt 137.0 lb

## 2019-05-05 DIAGNOSIS — Z515 Encounter for palliative care: Secondary | ICD-10-CM

## 2019-05-05 DIAGNOSIS — R1310 Dysphagia, unspecified: Secondary | ICD-10-CM

## 2019-05-05 DIAGNOSIS — R413 Other amnesia: Secondary | ICD-10-CM

## 2019-05-05 NOTE — Progress Notes (Signed)
[facility] Consult Note Telephone: [phone]  Fax: [phone]  PATIENT NAME: Dale Williams DOB: 1931-05-25 MRN: [id number]  PRIMARY CARE PROVIDER:   Juanell Fairly, MD  REFERRING PROVIDER:  Juanell Fairly, MD [place],  [place] [address]  RESPONSIBLE PARTY: [place] guardian [phone] or [phone]  RECOMMENDATIONS and PLAN:  1. Palliative care encounter Z51.5; Palliative medicine team will continue to support patient, patient's family, and medical team. Visit consisted of counseling and education dealing with the complex and emotionally intense issues of symptom management and palliative care in the setting of serious and potentially life-threatening illness  2. Dysphagia R13.10; secondary to dementia. Continue to monitor weights, appetite, aspiration precautions.   3. Memory loss R41.3 secondary to dementia. Continue with supportive measures as chronic disease remains Progressive.  ASSESSMENT:     7 / 28 / 2020 sodium 129, potassium 5.5, chloride 91, Co2 25, calcium 10.5, bun 61, creatinine 2.26, glucose 71  5 / 19 / 2020 weight 140.5 lbs 6 / 10 / 2020 weight 140.2 lbs 7 / 21 / 2020 weight 137 lbs BMI 19.1  I visited and observed Dale Williams. We talked about purpose for palliative care visit but limited cognitive impairment so difficulty understanding purpose. He did make eye contact smile and say thank you for visiting. We talked about how he's feeling today. Discussion very limited. He denies pain or shortness of breath. He did say that he was hungry. He was cooperative with assessment. Overall he appears to be debilitated, Progressive decline in the setting of dementia. Will contact [facility] for further discussion of goals of care, code status and most form. I have updated nursing staff no new changes to the current goals are plan of care.   I spent 30 minutes providing  this consultation,  from 10:30am to 11:00am. More than 50% of the time in this consultation was spent coordinating communication.   HISTORY OF PRESENT ILLNESS:  Dale Williams is a 85 y.o. year old male with multiple medical problems including Prostate cancer, colon cancer, persistent atrial fibrillation, heart failure with reduced ejection fracture, Alzheimer's dementia, chronic kidney disease, hypertension, hernia repair, colon surgery . Mr Kache continues to reside at [place] [facility]. He is bed-bound requires assistance with transfers come at ADLs and continues incontinence secondary do dementia. He does require assistance to be fed and appetite has been declining with weight loss. He is able to answer simple questions. At present he is lying in bed. He appears debilitated, chronically ill but comfortable. No visitors present. Palliative Care was asked to help address goals of care.   CODE STATUS: Full code  PPS: 30% HOSPICE ELIGIBILITY/DIAGNOSIS: TBD  PAST MEDICAL HISTORY:  Past Medical History:  Diagnosis Date  . Alzheimer's dementia ([place])   . Chronic kidney disease (CKD), stage III (moderate) (HCC)   . Colon cancer ([place])   . HFrEF (heart failure with reduced ejection fraction) ([place])   . Hypertension   . Persistent atrial fibrillation    a. CHADS2VASc 5 (CHF, HTN, age 43, vascular disease); b. not on [place] 2/2 dementia and fall risk  . Prostate cancer [facility])     SOCIAL HX:  Social History   Tobacco Use  . Smoking status: Former Smoker    Types: Cigars  . Smokeless tobacco: Never Used  . Tobacco comment: 2 cigars daily  Substance Use Topics  .  Alcohol use: Not Currently    ALLERGIES:  Allergies  Allergen Reactions  . Penicillins Anaphylaxis     PERTINENT MEDICATIONS:  Outpatient Encounter Medications as of 05/05/2019  Medication Sig  . allopurinol (ZYLOPRIM) 300 MG tablet Take 100 mg by mouth daily.   Marland Kitchen atorvastatin  (LIPITOR) 20 MG tablet Take 20 mg by mouth daily at 6 PM.   . calcitRIOL (ROCALTROL) 0.5 MCG capsule Take 0.5 mcg by mouth daily.   Marland Kitchen donepezil (ARICEPT) 10 MG tablet Take 10 mg by mouth at bedtime.   . furosemide (LASIX) 20 MG tablet Take 20 mg by mouth.  . metoprolol tartrate (LOPRESSOR) 25 MG tablet Take 25 mg by mouth 2 (two) times daily.  . tamsulosin (FLOMAX) 0.4 MG CAPS capsule Take 0.4 mg by mouth daily.   . carvedilol (COREG) 6.25 MG tablet Take 1 tablet (6.25 mg total) by mouth 2 (two) times daily with a meal for 30 days.  . furosemide (LASIX) 20 MG tablet Take by mouth.  . metoprolol tartrate (LOPRESSOR) 25 MG tablet Take 25 mg by mouth 2 (two) times daily.    No facility-administered encounter medications on file as of 05/05/2019.     PHYSICAL EXAM:   General: NAD, frail appearing, thin, elderly cognitively impaired male Cardiovascular: regular rate and rhythm Pulmonary: clear ant fields Abdomen: soft, nontender, + bowel sounds Extremities: no edema, no joint deformities Neurological: Weakness but otherwise nonfocal  Kay Ricciuti Ihor Gully, NP

## 2019-05-08 ENCOUNTER — Other Ambulatory Visit: Payer: Self-pay

## 2019-06-07 ENCOUNTER — Emergency Department
Admission: EM | Admit: 2019-06-07 | Discharge: 2019-06-07 | Disposition: A | Discharge status: Skilled Nursing Facility | Payer: Medicare HMO | Attending: Emergency Medicine | Admitting: Emergency Medicine

## 2019-06-07 ENCOUNTER — Other Ambulatory Visit: Payer: Self-pay

## 2019-06-07 DIAGNOSIS — Z87891 Personal history of nicotine dependence: Secondary | ICD-10-CM | POA: Insufficient documentation

## 2019-06-07 DIAGNOSIS — R000 Tachycardia, unspecified: Secondary | ICD-10-CM | POA: Diagnosis present

## 2019-06-07 DIAGNOSIS — I129 Hypertensive chronic kidney disease with stage 1 through stage 4 chronic kidney disease, or unspecified chronic kidney disease: Secondary | ICD-10-CM | POA: Insufficient documentation

## 2019-06-07 DIAGNOSIS — Z0000 Encounter for general adult medical examination without abnormal findings: Secondary | ICD-10-CM | POA: Insufficient documentation

## 2019-06-07 DIAGNOSIS — Z79899 Other long term (current) drug therapy: Secondary | ICD-10-CM | POA: Insufficient documentation

## 2019-06-07 DIAGNOSIS — N183 Chronic kidney disease, stage 3 (moderate): Secondary | ICD-10-CM | POA: Diagnosis not present

## 2019-06-07 DIAGNOSIS — Z85038 Personal history of other malignant neoplasm of large intestine: Secondary | ICD-10-CM | POA: Diagnosis not present

## 2019-06-07 DIAGNOSIS — G309 Alzheimer's disease, unspecified: Secondary | ICD-10-CM | POA: Diagnosis not present

## 2019-06-07 DIAGNOSIS — Z139 Encounter for screening, unspecified: Secondary | ICD-10-CM

## 2019-06-07 NOTE — ED Notes (Addendum)
Called social worker Charlena Cross [place]) and updated her on plan of care. Requested AVS form to her via email or fax when discharged.

## 2019-06-07 NOTE — ED Provider Notes (Signed)
[facility] Emergency Department Provider Note   ____________________________________________    I have reviewed the triage vital signs and the nursing notes.   HISTORY  Chief Complaint Screening exam  History limited by Alzheimer's dementia  HPI Dale Williams is a 85 y.o. male with a history of chronic kidney disease who was apparently at his nephrologist office, was sent for evaluation for reported tachycardia.  Patient has no complaints here.  No further history is available.  Review of medical records demonstrates a history of A. fib  Past Medical History:  Diagnosis Date  . Alzheimer's dementia ([place])   . Chronic kidney disease (CKD), stage III (moderate) (HCC)   . Colon cancer ([place])   . HFrEF (heart failure with reduced ejection fraction) ([place])   . Hypertension   . Persistent atrial fibrillation    a. CHADS2VASc 5 (CHF, HTN, age 76, vascular disease); b. not on [place] 2/2 dementia and fall risk  . Prostate cancer [facility])     Patient Active Problem List   Diagnosis Date Noted  . Memory deficits 12/30/2018  . Dysphagia 12/30/2018  . Palliative care encounter 12/30/2018  . Pressure injury of skin 12/07/2018    Past Surgical History:  Procedure Laterality Date  . COLON SURGERY    . HERNIA REPAIR      Prior to Admission medications   Medication Sig Start Date End Date Taking? Authorizing Provider  allopurinol (ZYLOPRIM) 300 MG tablet Take 100 mg by mouth daily.  05/17/17   [provider]  atorvastatin (LIPITOR) 20 MG tablet Take 20 mg by mouth daily at 6 PM.  05/17/17   [provider]  calcitRIOL (ROCALTROL) 0.5 MCG capsule Take 0.5 mcg by mouth daily.  11/10/18   [provider]  carvedilol (COREG) 6.25 MG tablet Take 1 tablet (6.25 mg total) by mouth 2 (two) times daily with a meal for 30 days. 12/09/18 01/08/19  Ripley Fraise, PA  donepezil (ARICEPT) 10 MG tablet Take 10 mg by mouth at bedtime.  10/04/17   [provider]  furosemide (LASIX) 20 MG tablet Take by mouth. 11/10/18 12/10/18  [provider]  furosemide (LASIX) 20 MG tablet Take 20 mg by mouth.    [provider]  metoprolol tartrate (LOPRESSOR) 25 MG tablet Take 25 mg by mouth 2 (two) times daily.  11/10/18 12/10/18  [provider]  metoprolol tartrate (LOPRESSOR) 25 MG tablet Take 25 mg by mouth 2 (two) times daily.    [provider]  tamsulosin (FLOMAX) 0.4 MG CAPS capsule Take 0.4 mg by mouth daily.  05/17/17   [provider]     Allergies Penicillins  Family History  Problem Relation Age of Onset  . Bone cancer Mother   . Hypertension Father     Social History Social History   Tobacco Use  . Smoking status: Former Smoker    Types: Cigars  . Smokeless tobacco: Never Used  . Tobacco comment: 2 cigars daily  Substance Use Topics  . Alcohol use: Not Currently  . Drug use: Never     Unable to obtain review of Systems    ____________________________________________   PHYSICAL EXAM:  VITAL SIGNS: ED Triage Vitals  Enc Vitals Group     BP 06/07/19 1144 127/75     Pulse Rate 06/07/19 1144 (!) 52     Resp 06/07/19 1144 16     Temp 06/07/19 1144 97.7 F (36.5 C)     Temp Source  06/07/19 1144 Oral     SpO2 06/07/19 1144 93 %     Weight 06/07/19 1141 63.5 kg (140 lb)     Height --      Head Circumference --      Peak Flow --      Pain Score --      Pain Loc --      Pain Edu? --      Excl. in [place]? --     Constitutional: Alert, well-appearing  . Mouth/Throat: Mucous membranes are moist.    Cardiovascular: Irregular rhythm, normal rate Respiratory: Normal respiratory effort.  No retractions. Gastrointestinal: Soft and nontender. No distention.   Neurologic: Patient has significant tremor. no gross focal neurologic deficits are appreciated.  Skin:  Skin is warm, dry and intact. No rash noted. Psychiatric: Mood and affect are normal. Speech and behavior are  normal.  ____________________________________________   LABS (all labs ordered are listed, but only abnormal results are displayed)  Labs Reviewed - No data to display ____________________________________________  EKG  ED ECG REPORT I, Lavonia Drafts, the attending physician, personally viewed and interpreted this ECG.  Date: 06/07/2019  Rhythm: Atrial fibrillation QRS Axis: Abnormal Intervals: Abnormal ST/T Wave abnormalities: Nonspecific changes Limited by significant background motion  ____________________________________________  RADIOLOGY  None ____________________________________________   PROCEDURES  Procedure(s) performed: No  Procedures   Critical Care performed: No ____________________________________________   INITIAL IMPRESSION / ASSESSMENT AND PLAN / ED COURSE  Pertinent labs & imaging results that were available during my care of the patient were reviewed by me and considered in my medical decision making (see chart for details).  Patient has been monitored on the cardiac monitor for 1 hour without any evidence of tachycardia.  He does have significant tremor.  Nurse reports that his heart rate was checked with pulse oximeter at an outside facility which may have been altered by tremor.  Given reassuring vitals no complaints, normal heart rate no further evaluation necessary at this time.    ____________________________________________   FINAL CLINICAL IMPRESSION(S) / ED DIAGNOSES  Final diagnoses:  Encounter for medical screening examination        Note:  This document was prepared using Dragon voice recognition software and may include unintentional dictation errors.   Lavonia Drafts, MD 06/07/19 1233

## 2019-06-07 NOTE — ED Notes (Signed)
Spoke with [employer]. No transportation available for pt, they requested he come via EMS.

## 2019-06-07 NOTE — Discharge Instructions (Addendum)
Patient was observed on the monitor for 1 hour in the emergency department, no tachycardia noted

## 2019-06-07 NOTE — ED Notes (Signed)
Pt unable to sign discharge r/t altered mental status.

## 2019-06-07 NOTE — ED Triage Notes (Signed)
Pt was at nephrology office and they called for high heart rate measured from pulse ox with patient having tremors in hands.  HR with EMS on monitor was in 30s.  Pt denies any complaints.

## 2019-07-05 ENCOUNTER — Other Ambulatory Visit: Payer: Self-pay

## 2019-07-05 ENCOUNTER — Non-Acute Institutional Stay: Payer: Medicare HMO | Admitting: Nurse Practitioner

## 2019-07-05 ENCOUNTER — Encounter: Payer: Self-pay | Admitting: Nurse Practitioner

## 2019-07-05 VITALS — BP 143/69 | HR 68 | Temp 98.7°F | Resp 18 | Wt 133.0 lb

## 2019-07-05 DIAGNOSIS — Z515 Encounter for palliative care: Secondary | ICD-10-CM

## 2019-07-05 NOTE — Progress Notes (Signed)
[place] Consult Note Telephone: [phone]  Fax: [phone]  PATIENT NAME: Dale Williams DOB: [birth year] MRN: [id number]  PRIMARY CARE PROVIDER:   Dr Yves Dill PROVIDER:  Dr [facility]  Dale Williams DSS guardian [phone] or [phone]  RECOMMENDATIONS and PLAN: 1.ACP: currently full code with aggressive interventions; will need to further discuss GOC, code status and possible hospice referral with weight loss and CKD IV if no wishes for dialysis, given severely debilitated state of health  2.Dysphagia; secondary todementia.Continue to monitor weights, appetite, aspiration precautions.   3.Memory loss secondary to dementia. Continue with supportive measures as chronic disease remains Progressive.  4. Palliative care encounter Palliative medicine team will continue to support patient, patient's family, and medical team. Visit consisted of counseling and education dealing with the complex and emotionally intense issues of symptom management and palliative care in the setting of serious and potentially life-threatening illness  I spent 40 minutes providing this consultation,  from 2:30pm to 3:10pm. More than 50% of the time in this consultation was spent coordinating communication.   HISTORY OF PRESENT ILLNESS:  Dale Williams is a 85 y.o. year old male with multiple medical problems includingProstate cancer, colon cancer, persistent atrial fibrillation, heart failure with reduced ejection fracture, Alzheimer's dementia, chronic kidney disease, hypertension, hernia repair, colon surgery. Dale Williams continues to reside at [place] at [facility]. He does remain bed bound, require staff to turn and position. He is total ADL dependence with incontinence. It does require assistance with feeding, appetite continues to be declined. Staff endorses he does sleep  a lot, spending all his time in bed. He did have a fall on 9/20 / 2020 with no noted injury. Dale Williams had a ED visit 06/07/2019 for tachycardia with workup negative and send back to [facility]. At present Dale Williams is lying in bed. He appears to be located, then look comfortable. No visitors present. I visited and observed Dale Williams. He did make eye contact with verbal cues. He did answer to his name then was oriented to self. He was cooperative with assessment. No meaningful discussion due to cognitive impairment. He did go back to sleep during palliative care visit. Emotional support provided. Will continue current goals of care and DNR in place. He is under DSS Guardian will update with palliative care visit and further discussion of goals of care.  9 / 25 / 2020 sodium 132, potassium 5.9, chloride 95, co2 26, calcium 10.1, bun 69, creatinine 3.03, glucose 80   Palliative Care was asked to help to continue to address goals of care.   CODE STATUS: Full code  PPS: 30% HOSPICE ELIGIBILITY/DIAGNOSIS: TBD  PAST MEDICAL HISTORY:  Past Medical History:  Diagnosis Date  . Alzheimer's dementia ([place])   . Chronic kidney disease (CKD), stage III (moderate) (HCC)   . Colon cancer ([place])   . HFrEF (heart failure with reduced ejection fraction) ([place])   . Hypertension   . Persistent atrial fibrillation    a. CHADS2VASc 5 (CHF, HTN, age 85, vascular disease); b. not on [place] 2/2 dementia and fall risk  . Prostate cancer [facility])     SOCIAL HX:  Social History   Tobacco Use  . Smoking status: Former Smoker    Types: Cigars  . Smokeless tobacco: Never Used  . Tobacco comment: 2 cigars daily  Substance Use Topics  . Alcohol use: Not Currently    ALLERGIES:  Allergies  Allergen Reactions  . Penicillins Anaphylaxis     PERTINENT MEDICATIONS:  Outpatient Encounter Medications as of 83/30/2020  Medication Sig  . allopurinol (ZYLOPRIM) 300 MG tablet Take 100 mg by mouth daily.   Marland Kitchen atorvastatin (LIPITOR)  20 MG tablet Take 20 mg by mouth daily at 6 PM.   . calcitRIOL (ROCALTROL) 0.5 MCG capsule Take 0.5 mcg by mouth daily.   . carvedilol (COREG) 6.25 MG tablet Take 1 tablet (6.25 mg total) by mouth 2 (two) times daily with a meal for 30 days.  Marland Kitchen donepezil (ARICEPT) 10 MG tablet Take 10 mg by mouth at bedtime.   . furosemide (LASIX) 20 MG tablet Take by mouth.  . furosemide (LASIX) 20 MG tablet Take 20 mg by mouth.  . metoprolol tartrate (LOPRESSOR) 25 MG tablet Take 25 mg by mouth 2 (two) times daily.   . metoprolol tartrate (LOPRESSOR) 25 MG tablet Take 25 mg by mouth 2 (two) times daily.  . tamsulosin (FLOMAX) 0.4 MG CAPS capsule Take 0.4 mg by mouth daily.    No facility-administered encounter medications on file as of 07/05/2019.     PHYSICAL EXAM:   General: frail appearing, thin, confused, debilitated male Cardiovascular: regular rate and rhythm Pulmonary: clear ant fields Abdomen: soft, nontender, + bowel sounds Extremities: no edema, no joint deformities Neurological: Weakness but otherwise nonfocal/functional quadriplegic  Dale Williams Dale Gully, NP

## 2019-07-10 DIAGNOSIS — H1132 Conjunctival hemorrhage, left eye: Secondary | ICD-10-CM | POA: Diagnosis not present

## 2019-08-03 DIAGNOSIS — F0390 Unspecified dementia without behavioral disturbance: Secondary | ICD-10-CM | POA: Diagnosis not present

## 2019-08-03 DIAGNOSIS — I509 Heart failure, unspecified: Secondary | ICD-10-CM | POA: Diagnosis not present

## 2019-08-18 ENCOUNTER — Encounter: Payer: Self-pay | Admitting: Nurse Practitioner

## 2019-08-18 ENCOUNTER — Non-Acute Institutional Stay: Payer: Medicare HMO | Admitting: Nurse Practitioner

## 2019-08-18 ENCOUNTER — Other Ambulatory Visit: Payer: Self-pay

## 2019-08-18 VITALS — BP 140/70 | HR 80 | Temp 98.0°F | Resp 20 | Wt 140.5 lb

## 2019-08-18 DIAGNOSIS — Z515 Encounter for palliative care: Secondary | ICD-10-CM | POA: Diagnosis not present

## 2019-08-18 DIAGNOSIS — R1310 Dysphagia, unspecified: Secondary | ICD-10-CM | POA: Diagnosis not present

## 2019-08-18 NOTE — Progress Notes (Signed)
[place] Consult Note Telephone: [phone]  Fax: [phone]  PATIENT NAME: Dale Williams DOB: Oct 13, 1930 MRN: [id number]  PRIMARY CARE PROVIDER:   Dr Yves Dill PROVIDER:  Dr [facility]  Trey Paula DSS guardian [phone] or [phone]  RECOMMENDATIONS and PLAN: 1.ACP: currently full code with aggressive interventions; will need to further discuss GOC, code status and possible hospice referral with weight loss and CKD IV if no wishes for dialysis, given severely debilitated state of health  2.Dysphagia; secondary todementia.Continue to monitor weights, appetite, aspiration precautions.   3.Memory loss secondary to dementia. Continue with supportive measures as chronic disease remains Progressive.  4. Palliative care encounter Palliative medicine team will continue to support patient, patient's family, and medical team. Visit consisted of counseling and education dealing with the complex and emotionally intense issues of symptom management and palliative care in the setting of serious and potentially life-threatening illness  I spent 35 minutes providing this consultation,  from 10:45am to 11:20am. More than 50% of the time in this consultation was spent coordinating communication.   HISTORY OF PRESENT ILLNESS:  Dale Williams is a 85 y.o. year old male with multiple medical problems including Prostate cancer, colon cancer, persistent atrial fibrillation, heart failure with reduced ejection fracture, Alzheimer's dementia, chronic kidney disease, hypertension, hernia repair, colon surgery. Dale Williams continues to reside at [place] at [facility]. He does remain that down require staff assistance for transfer, mobility, adl's. He has episodes of incontinence. He does require assistance with feeding appetite has been Fair with slight weight gain. He is  able to verbalize his name so cognitively impaired and limited with discussion and memory. No recent wounds, hospitalization. At present Dale Williams is lying in bed. Dale Williams appears thin, comfortable. No visitors present. I visited and observe Mr Williams. He did make eye contact with verbal cues. I asked how he was feeling and he verbalized okay. He replied node when asked if he was having symptoms of pain or shortness of breath. Limited verbal discussion with cognitive impairment. We talked about his food that he ate today with limited as he was having difficulty recalling. Dale Williams was cooperative with assessment. Medical goals to continue to focus on treat what is treatable. I have updated nursing.  Palliative Care was asked to help to continue to address goals of care.   CODE STATUS: Full  PPS: Full HOSPICE ELIGIBILITY/DIAGNOSIS: TBD  PAST MEDICAL HISTORY:  Past Medical History:  Diagnosis Date  . Alzheimer's dementia ([place])   . Chronic kidney disease (CKD), stage III (moderate)   . Colon cancer ([place])   . HFrEF (heart failure with reduced ejection fraction) ([place])   . Hypertension   . Persistent atrial fibrillation ([place])    a. CHADS2VASc 5 (CHF, HTN, age 77, vascular disease); b. not on [place] 2/2 dementia and fall risk  . Prostate cancer [facility])     SOCIAL HX:  Social History   Tobacco Use  . Smoking status: Former Smoker    Types: Cigars  . Smokeless tobacco: Never Used  . Tobacco comment: 2 cigars daily  Substance Use Topics  . Alcohol use: Not Currently    ALLERGIES:  Allergies  Allergen Reactions  . Penicillins Anaphylaxis     PERTINENT MEDICATIONS:  Outpatient Encounter Medications as of 08/18/2019  Medication Sig  . allopurinol (ZYLOPRIM) 300 MG tablet Take 100 mg by mouth daily.   Marland Kitchen atorvastatin (LIPITOR) 20  MG tablet Take 20 mg by mouth daily at 6 PM.   . calcitRIOL (ROCALTROL) 0.5 MCG capsule Take 0.5 mcg by mouth daily.   . carvedilol (COREG) 6.25 MG tablet Take  1 tablet (6.25 mg total) by mouth 2 (two) times daily with a meal for 30 days.  Marland Kitchen donepezil (ARICEPT) 10 MG tablet Take 10 mg by mouth at bedtime.   . furosemide (LASIX) 20 MG tablet Take by mouth.  . furosemide (LASIX) 20 MG tablet Take 20 mg by mouth.  . metoprolol tartrate (LOPRESSOR) 25 MG tablet Take 25 mg by mouth 2 (two) times daily.   . metoprolol tartrate (LOPRESSOR) 25 MG tablet Take 25 mg by mouth 2 (two) times daily.  . tamsulosin (FLOMAX) 0.4 MG CAPS capsule Take 0.4 mg by mouth daily.    No facility-administered encounter medications on file as of 08/18/2019.     PHYSICAL EXAM:   General: NAD, frail appearing, thin elderly confused male Cardiovascular: regular rate and rhythm Pulmonary: clear ant fields Abdomen: soft, nontender, + bowel sounds GU: no suprapubic tenderness Extremities: no edema, no joint deformities Neurological: generalized weakness, non-ambulatory  Christin Z Gusler, NP

## 2019-10-18 ENCOUNTER — Other Ambulatory Visit: Payer: Self-pay

## 2019-10-18 ENCOUNTER — Non-Acute Institutional Stay: Payer: Medicare HMO | Admitting: Nurse Practitioner

## 2019-10-18 ENCOUNTER — Encounter: Payer: Self-pay | Admitting: Nurse Practitioner

## 2019-10-18 VITALS — BP 140/67 | HR 67 | Temp 97.4°F | Resp 18 | Wt 140.0 lb

## 2019-10-18 DIAGNOSIS — Z515 Encounter for palliative care: Secondary | ICD-10-CM

## 2019-10-18 DIAGNOSIS — F0390 Unspecified dementia without behavioral disturbance: Secondary | ICD-10-CM

## 2019-10-18 NOTE — Progress Notes (Signed)
[facility] Consult Note Telephone: [phone]  Fax: [phone]  PATIENT NAME: Dale Williams DOB: 07/25/1931 MRN: [id number] PRIMARY CARE PROVIDER:Dr Yves Dill PROVIDER:Dr [facility]  Trey Paula DSS guardian [phone] or [phone]  RECOMMENDATIONS and PLAN: 1.ACP: DNR placed in vynca/epic; medical goals of care to focus on comfort  2.Dysphagia; secondary todementia.Continue to monitor weights, appetite, aspiration precautions.   3.Palliative care encounter Palliative medicine team will continue to support patient, patient's family, and medical team. Visit consisted of counseling and education dealing with the complex and emotionally intense issues of symptom management and palliative care in the setting of serious and potentially life-threatening illness  I spent 35 minutes providing this consultation,  from 8:55am to 9:35am. More than 50% of the time in this consultation was spent coordinating communication.   HISTORY OF PRESENT ILLNESS:  Dale Williams is a 86 y.o. year old male with multiple medical problems including Prostate cancer, colon cancer, persistent atrial fibrillation, heart failure with reduced ejection fracture, Alzheimer's dementia, chronic kidney disease IV, hypertension, hernia repair, colon surgery.  Dale Williams continues to reside in [place] at [facility]. He does remain bed-bound require staff to transfer and move him for mobility. He is ADL dependence . Mr Billee Williams is incontinent bowel and bladder. Dale Williams does require assistance with tray setup in feeding. Last primary note 12 / 22 / 2020 for positive with pending Nephrology appointment with worsening stage 4 chronic kidney disease. Dale Williams was changed to a do not resuscitate and the Nephrology appointment was canceled with goals to focus on comfort. Dale Williams is  oriented to self though otherwise confused. Appetite does remain declined. Staff endorses no other changes, hospitalizations or wounds. Dale Williams had a fall 10/06/2019 with no noted injury. At present Dale Williams is lying in bed. Dale Williams denies pain. He makes eye contact with verbal cues. Limited verbal discussion with cognitive impairment. Dale Williams was cooperative with assessment. Dale Williams appears comfortable. I did call Trey Paula DSS Guardian for update on palliative care visit. Medical goals focusing on Comfort. DNR does remain in place will put in Vynca/Epic. Will follow up in one month if needed or sooner should he declined. I updated nursing staff. Palliative Care was asked to help to continue to address goals of care.   CODE STATUS: DNR  PPS: 30% HOSPICE ELIGIBILITY/DIAGNOSIS: TBD  PAST MEDICAL HISTORY:  Past Medical History:  Diagnosis Date  . Alzheimer's dementia ([place])   . Chronic kidney disease (CKD), stage III (moderate)   . Colon cancer ([place])   . HFrEF (heart failure with reduced ejection fraction) ([place])   . Hypertension   . Persistent atrial fibrillation ([place])    a. CHADS2VASc 5 (CHF, HTN, age 51, vascular disease); b. not on [place] 2/2 dementia and fall risk  . Prostate cancer [facility])     SOCIAL HX:  Social History   Tobacco Use  . Smoking status: Former Smoker    Types: Cigars  . Smokeless tobacco: Never Used  . Tobacco comment: 2 cigars daily  Substance Use Topics  . Alcohol use: Not Currently    ALLERGIES:  Allergies  Allergen Reactions  . Penicillins Anaphylaxis     PERTINENT MEDICATIONS:  Outpatient Encounter Medications as of 10/18/2019  Medication Sig  . allopurinol (ZYLOPRIM) 300 MG tablet Take 100 mg by mouth daily.   Marland Kitchen atorvastatin (LIPITOR) 20 MG tablet Take 20 mg by mouth daily  at 6 PM.   . calcitRIOL (ROCALTROL) 0.5 MCG capsule Take 0.5 mcg by mouth daily.   . carvedilol (COREG) 6.25 MG tablet Take 1 tablet (6.25 mg total) by mouth 2 (two)  times daily with a meal for 30 days.  Marland Kitchen donepezil (ARICEPT) 10 MG tablet Take 10 mg by mouth at bedtime.   . furosemide (LASIX) 20 MG tablet Take by mouth.  . furosemide (LASIX) 20 MG tablet Take 20 mg by mouth.  . metoprolol tartrate (LOPRESSOR) 25 MG tablet Take 25 mg by mouth 2 (two) times daily.   . metoprolol tartrate (LOPRESSOR) 25 MG tablet Take 25 mg by mouth 2 (two) times daily.  . tamsulosin (FLOMAX) 0.4 MG CAPS capsule Take 0.4 mg by mouth daily.    No facility-administered encounter medications on file as of 10/18/2019.    PHYSICAL EXAM:   General: NAD, frail appearing, thin elderly, chronically ill, confused male Cardiovascular: regular rate and rhythm Pulmonary: clear ant fields Abdomen: soft, nontender, + bowel sounds Extremities: no edema, no joint deformities Neurological: functional quadriplegic  Monicia Tse Ihor Gully, NP

## 2019-12-08 ENCOUNTER — Encounter: Payer: Self-pay | Admitting: Nurse Practitioner

## 2019-12-08 ENCOUNTER — Non-Acute Institutional Stay: Payer: Medicare HMO | Admitting: Nurse Practitioner

## 2019-12-08 ENCOUNTER — Other Ambulatory Visit: Payer: Self-pay

## 2019-12-08 VITALS — BP 144/70 | HR 66 | Temp 97.5°F | Resp 18 | Wt 140.0 lb

## 2019-12-08 DIAGNOSIS — F0390 Unspecified dementia without behavioral disturbance: Secondary | ICD-10-CM

## 2019-12-08 DIAGNOSIS — Z515 Encounter for palliative care: Secondary | ICD-10-CM

## 2019-12-08 NOTE — Progress Notes (Signed)
[facility] Palliative Care Consult Note Telephone: [phone]  Fax: [phone]  PATIENT NAME: Dale Williams DOB: 1931-02-17 MRN: [id number] PRIMARY CARE PROVIDER:Dr Yves Dill PROVIDER:Dr Hodges/[place]  Responsible party: Carly Recher DSS Guardian [phone]  RECOMMENDATIONS and PLAN: 1.ACP: DNR in vynca/epic; medical goals of care to focus on comfort  2.Dysphagia; secondary todementia.Continue to monitor weights, appetite, aspiration precautions.   3.Palliative care encounter Palliative medicine team will continue to support patient, patient's family, and medical team. Visit consisted of counseling and education dealing with the complex and emotionally intense issues of symptom management and palliative care in the setting of serious and potentially life-threatening illness  I spent 35 minutes providing this consultation,  from 9:25am to 10:00am. More than 50% of the time in this consultation was spent coordinating communication.   HISTORY OF PRESENT ILLNESS:  Dale Williams is a 86 y.o. year old male with multiple medical problems including Prostate cancer, colon cancer, persistent atrial fibrillation, heart failure with reduced ejection fracture, Alzheimer's dementia, chronic kidney disease IV, hypertension, hernia repair, colon surgery. Dale Williams continues to reside at [place] at [facility]. Dale Williams continues to remain bed-bound, total ADL dependence, incontinence and requires assistance for transferring. Dale Williams does require assistance for tray setup but is able to feed himself in appetite has been fair. Weight has been stable. Last primary provider visit by [facility] nurse practitioner 11/13/2019 for comprehensive review and acute visit of positive covid-19. Dale Williams was tested positive for covid 07/11/2019 and 2 / 4 / 2021 remain asymptomatic. Chronic kidney  disease stage 4 desires no dialysis. Medical goals focused on comfort with DNR, do not intubate, do not hospitalize. No recent falls, hospitalizations, wounds. Dale Williams is oriented to self. At times he is able to answer simple questions. Staff endorses no new concerns. At present Dale Williams is lying in bed. Dale Williams appears elderly, debilitated but comfortable. No visitors present. I visited and observe Dale Williams. Explained purpose for palliative care visit. Dale Williams with limited understanding. Dale Williams did make eye contact and answer simple questions. Dale Williams tonight symptoms of pain or shortness of breath. We talked about his appetite. Dale Williams endorses that he is hungry. Dale Williams is always hungry. Limited verbal discussion with cognitive impairment. We talked about what he likes to do. We talked about residing at the facility. Emotional support provided. Dale Williams was cooperative with assessment. No new changes to current goals are plan of care. I will fax notes to DSS and also send blank most form to be completed for further discussion of medical goals of care including questions about hospitalization, IV fluids, antibiotics, feeding tubes. With Dale Williams current clinical state, severe disability and cognitive impairment with progressive disease dementia and chronic kidney disease recommendations are for no feeding tube, IV fluids if indicated, antibiotics if indicated, do not hospitalized, comfort care.  1 / 25 / 2021 weight 140.0 lbs 2 / 1 / 2021 weight 140 lbs 12/04/2019 weight 140 lbs  Palliative Care was asked to help to continue to address goals of care.   CODE STATUS: DNR  PPS: 30% HOSPICE ELIGIBILITY/DIAGNOSIS: TBD  PAST MEDICAL HISTORY:  Past Medical History:  Diagnosis Date  . Alzheimer's dementia ([place])   . Chronic kidney disease (CKD), stage III (moderate)   . Colon cancer ([place])   . HFrEF (heart failure with reduced ejection fraction) ([place])   . Hypertension   .  Persistent atrial fibrillation ([place])    a. CHADS2VASc 5 (CHF, HTN, age 32, vascular disease); b. not on [place] 2/2 dementia and fall risk  . Prostate cancer [facility])     SOCIAL HX:  Social History   Tobacco Use  . Smoking status: Former Smoker    Types: Cigars  . Smokeless tobacco: Never Used  . Tobacco comment: 2 cigars daily  Substance Use Topics  . Alcohol use: Not Currently    ALLERGIES:  Allergies  Allergen Reactions  . Penicillins Anaphylaxis     PERTINENT MEDICATIONS:  Outpatient Encounter Medications as of 12/08/2019  Medication Sig  . allopurinol (ZYLOPRIM) 300 MG tablet Take 100 mg by mouth daily.   Marland Kitchen atorvastatin (LIPITOR) 20 MG tablet Take 20 mg by mouth daily at 6 PM.   . calcitRIOL (ROCALTROL) 0.5 MCG capsule Take 0.5 mcg by mouth daily.   . carvedilol (COREG) 6.25 MG tablet Take 1 tablet (6.25 mg total) by mouth 2 (two) times daily with a meal for 30 days.  Marland Kitchen donepezil (ARICEPT) 10 MG tablet Take 10 mg by mouth at bedtime.   . furosemide (LASIX) 20 MG tablet Take by mouth.  . furosemide (LASIX) 20 MG tablet Take 20 mg by mouth.  . metoprolol tartrate (LOPRESSOR) 25 MG tablet Take 25 mg by mouth 2 (two) times daily.   . metoprolol tartrate (LOPRESSOR) 25 MG tablet Take 25 mg by mouth 2 (two) times daily.  . tamsulosin (FLOMAX) 0.4 MG CAPS capsule Take 0.4 mg by mouth daily.    No facility-administered encounter medications on file as of 12/08/2019.    PHYSICAL EXAM:   General: NAD, frail appearing, thin elderly, confused male, oriented to self Cardiovascular: regular rate and rhythm Pulmonary: clear ant fields Abdomen: soft, nontender, + bowel sounds Extremities: no edema, no joint deformities/muscle wasting Neurological: functional quadriplegic  Dejay Kronk Ihor Gully, NP

## 2020-02-02 ENCOUNTER — Non-Acute Institutional Stay: Payer: Medicare Other | Admitting: Nurse Practitioner

## 2020-02-02 ENCOUNTER — Other Ambulatory Visit: Payer: Self-pay

## 2020-02-02 ENCOUNTER — Encounter: Payer: Self-pay | Admitting: Nurse Practitioner

## 2020-02-02 VITALS — BP 126/70 | HR 82 | Resp 18 | Wt 132.6 lb

## 2020-02-02 DIAGNOSIS — Z515 Encounter for palliative care: Secondary | ICD-10-CM

## 2020-02-02 DIAGNOSIS — F0390 Unspecified dementia without behavioral disturbance: Secondary | ICD-10-CM

## 2020-02-02 NOTE — Progress Notes (Signed)
[facility] Palliative Care Consult Note Telephone: [phone]  Fax: [phone]  PATIENT NAME: Dale Williams DOB: 1931-10-01 MRN: [id number] PRIMARY CARE PROVIDER:Dr Yves Dill PROVIDER:Dr Hodges/[place]  Responsible party: [place] [phone]  RECOMMENDATIONS and PLAN: 1.ACP:DNR in vynca/epic; medical goals of care to focus on comfort  2.Dysphagia; secondary todementia continues with weight loss.Continue to monitor weights, appetite, aspiration precautions.   3.Palliative care encounter Palliative medicine team will continue to support patient, patient's family, and medical team. Visit consisted of counseling and education dealing with the complex and emotionally intense issues of symptom management and palliative care in the setting of serious and potentially life-threatening illness  I spent 47 minutes providing this consultation,  Beginning at 12:30pm. More than 50% of the time in this consultation was spent coordinating communication.   HISTORY OF PRESENT ILLNESS:  Dale Williams is a 86 y.o. year old male with multiple medical problems including Prostate cancer, colon cancer, persistent atrial fibrillation, heart failure with reduced ejection fracture, Alzheimer's dementia, chronic kidney diseaseIV, hypertension, hernia repair, colon surgery. Dale Williams continues to reside at [place] at [facility]. Dale Williams remains dependent for mobility, transfers, adl's. Dale Williams does require assistance with feeding an appetite has been Fair. Weight 132.6 what BMI 18.5 with 13.4lb loss in last 4 weeks. Staff reports no changes or concerns. No recent hospitalizations, falls, wounds. At present Dale Williams is lying in bed asleep. Dale Williams did awake to verbal cues. I visited and observed Dale Williams. Attempted to explain for visit palliative care visit. Dale.  Williams replied thank you. Dale Williams did make eye contact throughout palliative care visit and answer basic questions. Dale Williams answered no when asked if he was having symptoms of pain or shortness of breath. We talked about his appetite which he shared is good but did not recall what he had for breakfast. Dale Williams was cooperative with assessment. Limited verbal discussion with cognitive impairment. Emotional support provided. Medical goals reviewed. DNR in place. No new changes or recommendations to current goals or plan of care. Will fax Georgiann Hahn DSS Guardian notes Palliative Care was asked to help to continue to address goals of care.   CODE STATUS: DNR  PPS: 30% HOSPICE ELIGIBILITY/DIAGNOSIS: TBD  PAST MEDICAL HISTORY:  Past Medical History:  Diagnosis Date  . Alzheimer's dementia ([place])   . Chronic kidney disease (CKD), stage III (moderate)   . Colon cancer ([place])   . HFrEF (heart failure with reduced ejection fraction) ([place])   . Hypertension   . Persistent atrial fibrillation ([place])    a. CHADS2VASc 5 (CHF, HTN, age 86, vascular disease); b. not on [place] 2/2 dementia and fall risk  . Prostate cancer [facility])     SOCIAL HX:  Social History   Tobacco Use  . Smoking status: Former Smoker    Types: Cigars  . Smokeless tobacco: Never Used  . Tobacco comment: 2 cigars daily  Substance Use Topics  . Alcohol use: Not Currently    ALLERGIES:  Allergies  Allergen Reactions  . Penicillins Anaphylaxis     PERTINENT MEDICATIONS:  Outpatient Encounter Medications as of 02/02/2020  Medication Sig  . allopurinol (ZYLOPRIM) 300 MG tablet Take 100 mg by mouth daily.   Marland Kitchen atorvastatin (LIPITOR) 20 MG tablet Take 20 mg by mouth daily at 6 PM.   . calcitRIOL (ROCALTROL) 0.5 MCG capsule Take 0.5 mcg by mouth daily.   Marland Kitchen  carvedilol (COREG) 6.25 MG tablet Take 1 tablet (6.25 mg total) by mouth 2 (two) times daily with a meal for 30 days.  Marland Kitchen donepezil (ARICEPT) 10 MG tablet Take 10 mg by mouth  at bedtime.   . furosemide (LASIX) 20 MG tablet Take by mouth.  . furosemide (LASIX) 20 MG tablet Take 20 mg by mouth.  . metoprolol tartrate (LOPRESSOR) 25 MG tablet Take 25 mg by mouth 2 (two) times daily.   . metoprolol tartrate (LOPRESSOR) 25 MG tablet Take 25 mg by mouth 2 (two) times daily.  . tamsulosin (FLOMAX) 0.4 MG CAPS capsule Take 0.4 mg by mouth daily.    No facility-administered encounter medications on file as of 02/02/2020.    PHYSICAL EXAM:   General: NAD, frail appearing, thin elderly male, confused Cardiovascular: regular rate and rhythm Pulmonary: clear ant fields Abdomen: soft, nontender, + bowel sounds Neurological: functionally quadriplegic  Christin Ihor Gully, NP

## 2020-04-04 ENCOUNTER — Encounter: Payer: Self-pay | Admitting: Nurse Practitioner

## 2020-04-04 ENCOUNTER — Non-Acute Institutional Stay: Payer: Medicare Other | Admitting: Nurse Practitioner

## 2020-04-04 ENCOUNTER — Other Ambulatory Visit: Payer: Self-pay

## 2020-04-04 VITALS — Wt 132.2 lb

## 2020-04-04 DIAGNOSIS — F0390 Unspecified dementia without behavioral disturbance: Secondary | ICD-10-CM

## 2020-04-04 DIAGNOSIS — Z515 Encounter for palliative care: Secondary | ICD-10-CM

## 2020-04-04 NOTE — Progress Notes (Signed)
[facility] Palliative Care Consult Note Telephone: [phone]  Fax: [phone]  PATIENT NAME: Dale Williams DOB: 08-Nov-1930 MRN: [id number]  PRIMARY CARE PROVIDER:Dr Yves Dill PROVIDER:Dr Hodges/ [place]  Responsible party:Kallie Morrow[place]enn[place]; DSS Guardian [phone]  RECOMMENDATIONS and PLAN: 1.ACP:DNR in vynca/epic; medical goals of care to focus on comfort  2.Dysphagia; secondary todementia continues with weight loss.Continue to monitor weights, appetite, aspiration precautions.   3.Palliative care encounter Palliative medicine team will continue to support patient, patient's family, and medical team. Visit consisted of counseling and education dealing with the complex and emotionally intense issues of symptom management and palliative care in the setting of serious and potentially life-threatening illness  I spent 50 minutes providing this consultation,  Start at 11:30am. More than 50% of the time in this consultation was spent coordinating communication.   HISTORY OF PRESENT ILLNESS:  Rondell Pardon is a 86 y.o. year old male with multiple medical problems including Prostate cancer, colon cancer, persistent atrial fibrillation, heart failure with reduced ejection fracture, Alzheimer's dementia, chronic kidney diseaseIV, hypertension, hernia repair, colon surgery.Mr Goddard continues to reside in [place] at [facility]. Mr Leicht is functionally quadriplegic, requires assistance for positioning, turning, mobility, toileting, ADLs. Mr Kiner does feed himself with assistance and tray setup. No weight loss in the last 8 weeks with BMI 18.4 Mr Garabedian remains on a regular diet minced and moist texture and moderate thickened consistency. Mr Keidel has had no recent hospitalizations, Falls, wounds, infections. Last primary provider note from [facility] practitioner  02/23/2020 for Thrombocytopenia with is ongoing with platelets less than 150 on repeated Labs. No evidence of bleeding. Medical goals focus on DNR, do not intubate, do not hospitalized with goals to focus more on comfort. Staff endorses no new changes their concerns. Mr Brine is oriented to self otherwise confused. At present Mr. Gail is lying in bed. Mr. Schweiss appears comfortable, debilitated, thin. No visitors present. I visited and observe Mr. Tapley. Mr. Enriquez does make eye contact with her will choose and answer simple questions. Mr Molstad denies any symptoms of pain or shortness of breath. Mr Dartt denies being hungry. Limited verbal discussion with cognitive impairment. Mr. Iles was cooperative with assessment. Emotional support provided. Medical goals reviewed. No changes are recommendations will continue to follow with next visit in 1 month for weight check and monitoring for decline. Will fax next to DSS. I have updated nursing staff  Palliative Care was asked to help to continue to address goals of care.   CODE STATUS: DNR  PPS: 30% HOSPICE ELIGIBILITY/DIAGNOSIS: TBD  PAST MEDICAL HISTORY:  Past Medical History:  Diagnosis Date  . Alzheimer's dementia ([place])   . Chronic kidney disease (CKD), stage III (moderate)   . Colon cancer ([place])   . HFrEF (heart failure with reduced ejection fraction) ([place])   . Hypertension   . Persistent atrial fibrillation ([place])    a. CHADS2VASc 5 (CHF, HTN, age 3, vascular disease); b. not on [place] 2/2 dementia and fall risk  . Prostate cancer [facility])     SOCIAL HX:  Social History   Tobacco Use  . Smoking status: Former Smoker    Types: Cigars  . Smokeless tobacco: Never Used  . Tobacco comment: 2 cigars daily  Substance Use Topics  . Alcohol use: Not Currently    ALLERGIES:  Allergies  Allergen Reactions  . Penicillins Anaphylaxis     PERTINENT MEDICATIONS:  Outpatient Encounter Medications as  of 04/04/2020  Medication Sig  . allopurinol  (ZYLOPRIM) 300 MG tablet Take 100 mg by mouth daily.   Marland Kitchen atorvastatin (LIPITOR) 20 MG tablet Take 20 mg by mouth daily at 6 PM.   . calcitRIOL (ROCALTROL) 0.5 MCG capsule Take 0.5 mcg by mouth daily.   . carvedilol (COREG) 6.25 MG tablet Take 1 tablet (6.25 mg total) by mouth 2 (two) times daily with a meal for 30 days.  Marland Kitchen donepezil (ARICEPT) 10 MG tablet Take 10 mg by mouth at bedtime.   . furosemide (LASIX) 20 MG tablet Take by mouth.  . furosemide (LASIX) 20 MG tablet Take 20 mg by mouth.  . metoprolol tartrate (LOPRESSOR) 25 MG tablet Take 25 mg by mouth 2 (two) times daily.   . metoprolol tartrate (LOPRESSOR) 25 MG tablet Take 25 mg by mouth 2 (two) times daily.  . tamsulosin (FLOMAX) 0.4 MG CAPS capsule Take 0.4 mg by mouth daily.    No facility-administered encounter medications on file as of 04/04/2020.    PHYSICAL EXAM:   General: NAD, frail appearing, thin, debilitated, confused male Cardiovascular: regular rate and rhythm Pulmonary: clear ant fields Neurological: functional quadriplegic Zaylie Gisler Ihor Gully, NP

## 2020-05-22 IMAGING — DX DG CHEST 1V PORT
1 series · 1 of 1 positions shown · non-contrast
Comparison: Radiographs 06/04/2018

CLINICAL DATA: Cough. Confusion. Shortness of breath.

EXAM:
PORTABLE CHEST 1 VIEW

[chest ap]
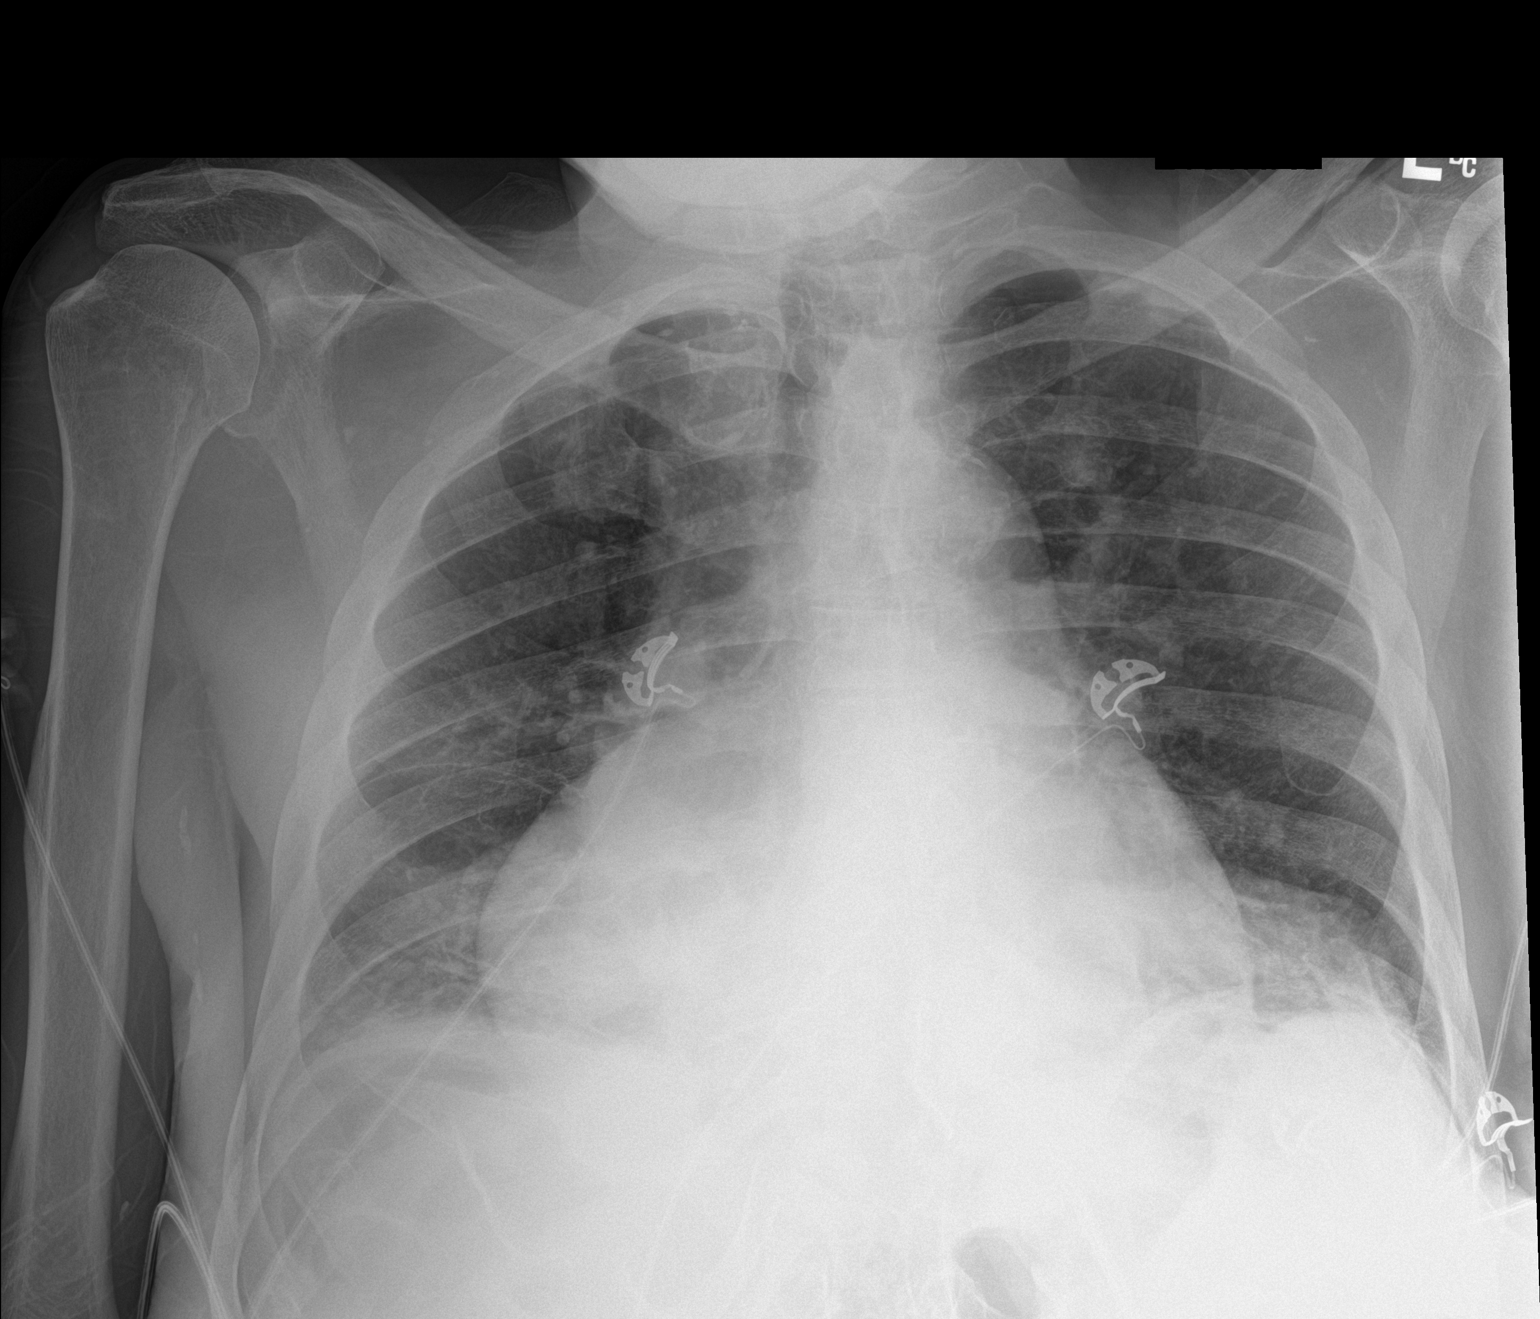

[1 of 1 positions shown; findings below may reference images not displayed]

FINDINGS: Lower lung volumes from prior exam. Progressive cardiomegaly with
slight globular configuration. Aortic atherosclerosis. Patchy
bibasilar opacities. No large pleural effusion. No pneumothorax. No
acute osseous abnormalities.
IMPRESSION: 1. Low lung volumes with progressive cardiomegaly over the past 7
months. Globular configuration of the cardiac silhouette can be seen
with pericardial effusion.
2. Patchy bibasilar opacities, left greater than right. This may
represent atelectasis, aspiration, or pneumonia.
3.  Aortic Atherosclerosis ([id number]).

## 2020-05-22 IMAGING — CT CT HEAD W/O CM
3 series · 15 of 47 positions shown, 18 images · non-contrast
Comparison: Head CT 05/17/2014

CLINICAL DATA: Subacute neurologic deficit. Confusion.

EXAM:
CT HEAD WITHOUT CONTRAST
TECHNIQUE: Contiguous axial images were obtained from the base of the skull
through the vertex without intravenous contrast.

[Series 2: head wo · axial · 0.43mm/px · z∈[+486,+611]mm · 9 of 31 slices shown, 12 images]
[im 3/31  brain]
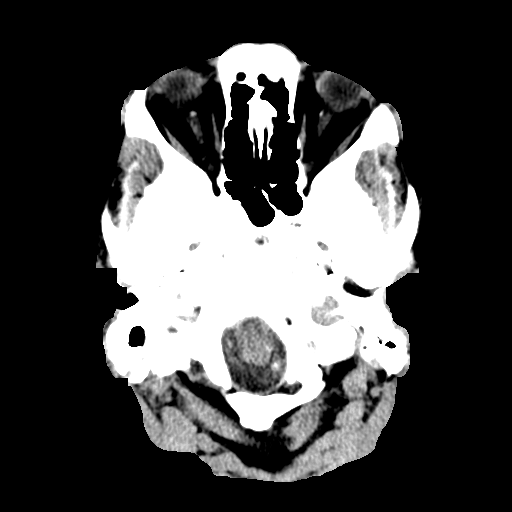
[im 3/31  bone]
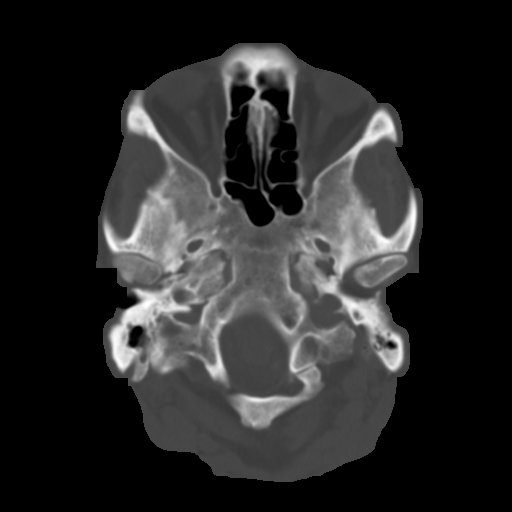
[im 6/31  brain]
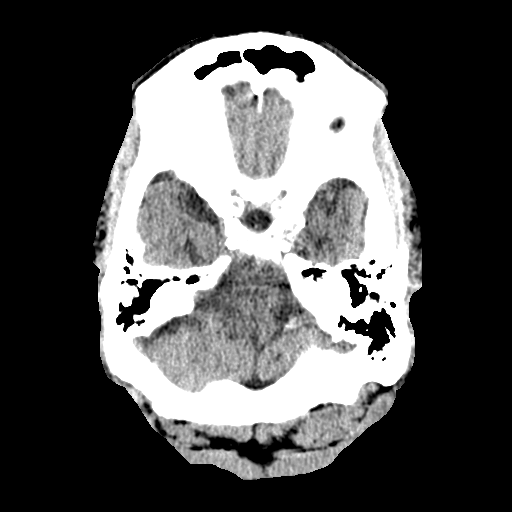
[im 9/31  brain]
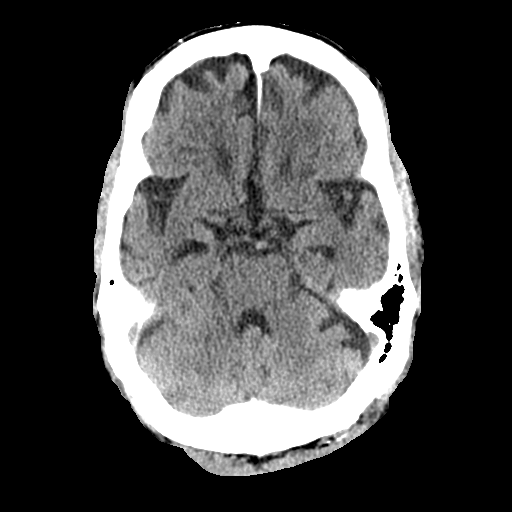
[im 12/31  brain]
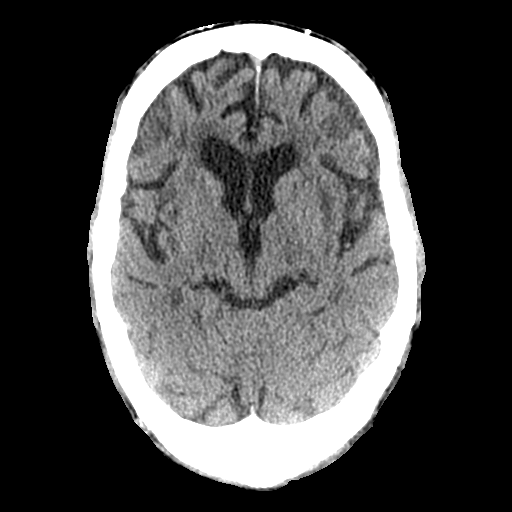
[im 16/31  brain]
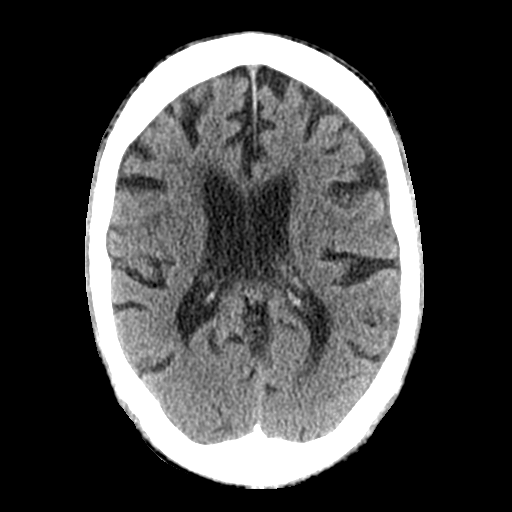
[im 16/31  bone]
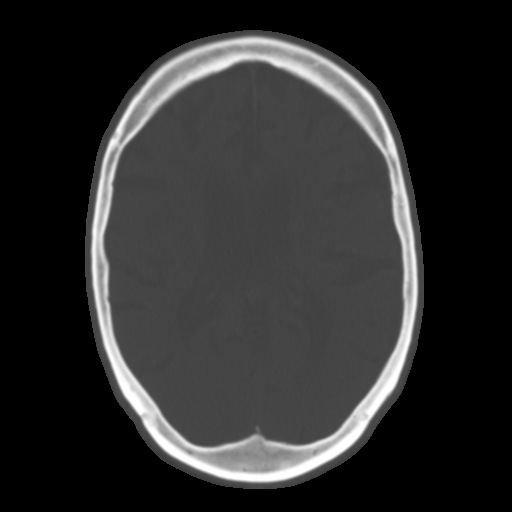
[im 19/31  brain]
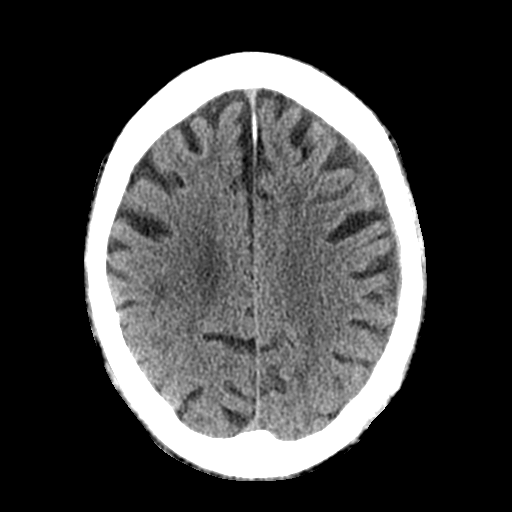
[im 22/31  brain]
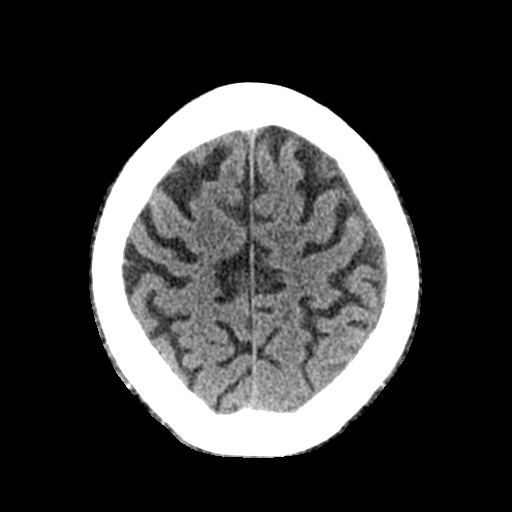
[im 25/31  brain]
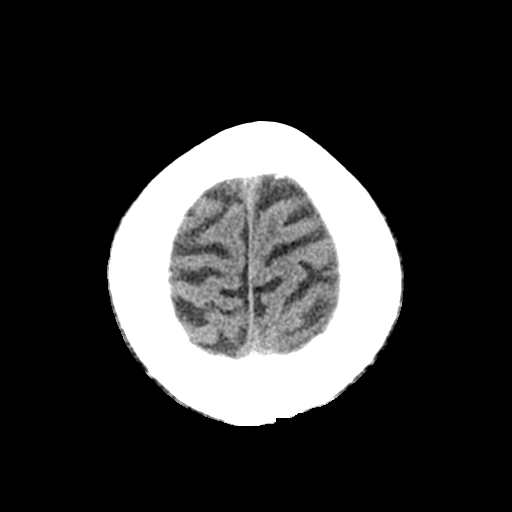
[im 28/31  brain]
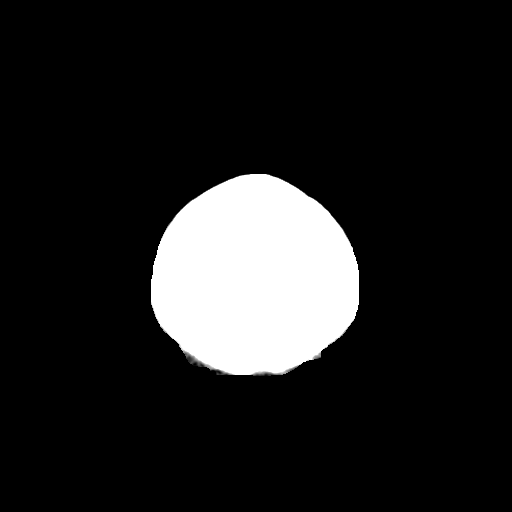
[im 28/31  bone]
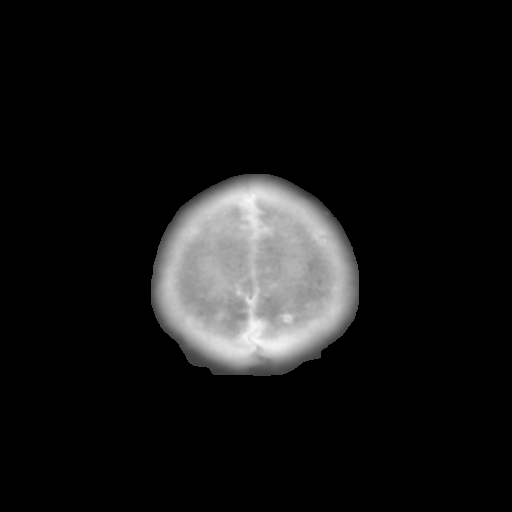

[Series 4: coronal soft tissue · coronal · 0.31mm/px · 3 of 68 slices shown]
[im 23/68  brain]
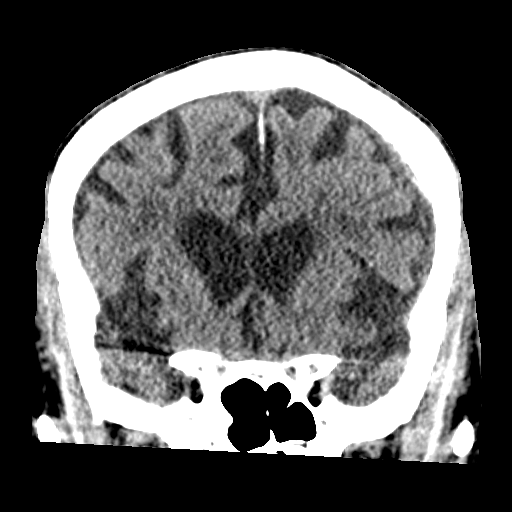
[im 30/68  brain]
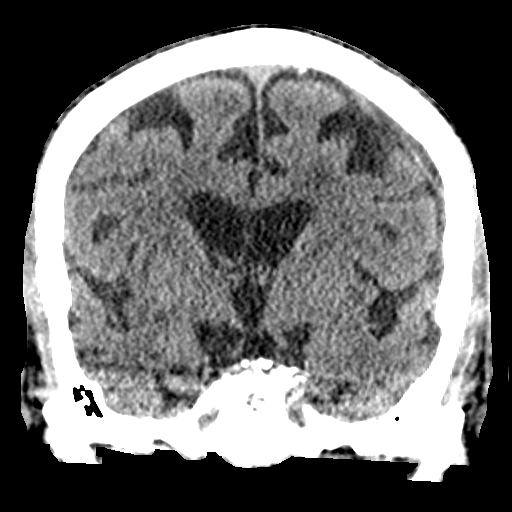
[im 38/68  brain]
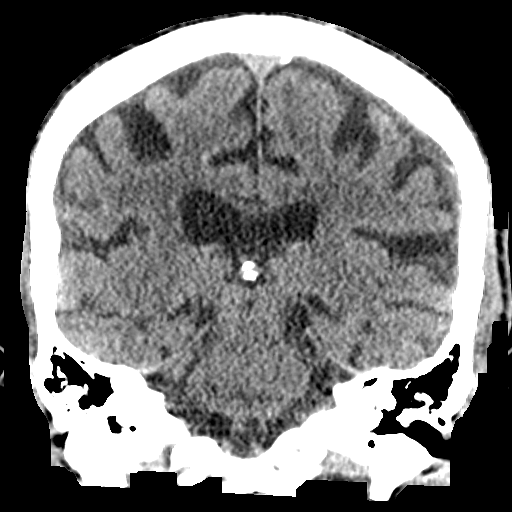

[Series 5: sagittal soft tissue · sagittal · 0.31mm/px · 3 of 53 slices shown]
[im 18/53  brain]
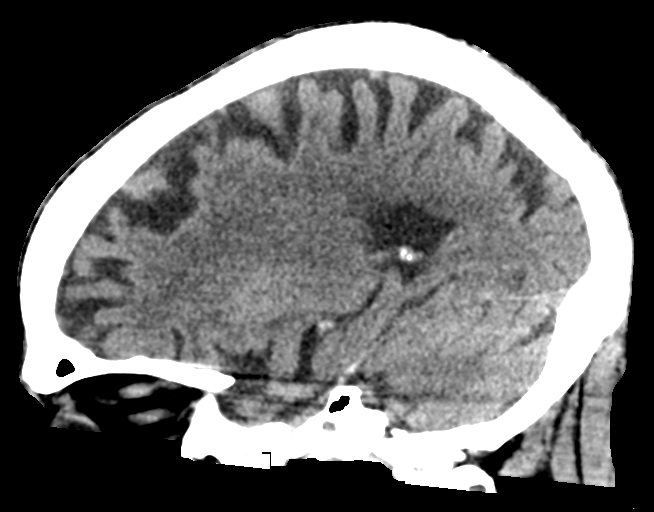
[im 27/53  brain]
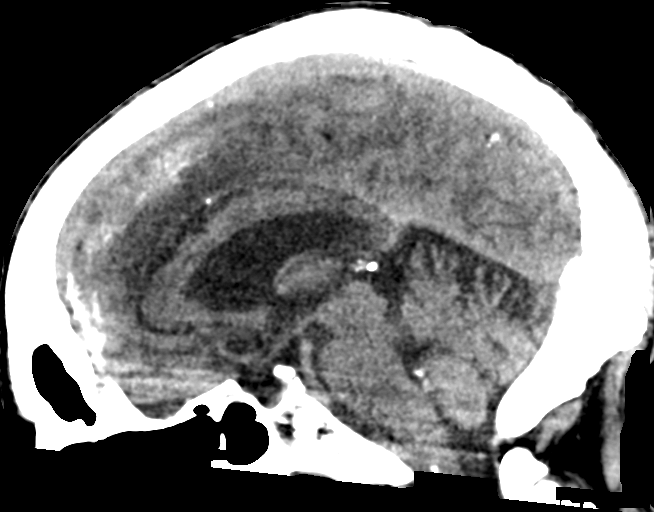
[im 35/53  brain]
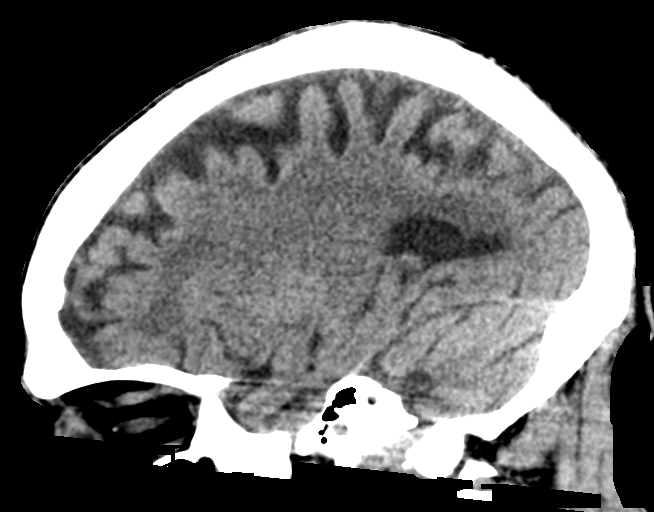

[15 of 47 positions shown; findings below may reference images not displayed]

FINDINGS: Brain: Thin left subdural hygroma measures up to 5 mm primarily in
the frontal region. No evidence of hemorrhagic component. There is
approximately 3 mm left right midline shift. No cyst new from prior
exam. No hemorrhage or evidence of acute ischemia. Generalized
atrophy with mild chronic small vessel ischemia. No hydrocephalus.

Vascular: Atherosclerosis of skullbase vasculature without
hyperdense vessel or abnormal calcification.

Skull: No fracture or focal lesion.

Sinuses/Orbits: Paranasal sinuses and mastoid air cells are clear.
The visualized orbits are unremarkable.

Other: None.
IMPRESSION: 1. Thin left subdural hygroma measures up to 5 mm primarily in the
frontal region. There is 3 mm right midline shift. No evidence of
hemorrhagic component. This is age indeterminate, but new from
05/17/2014 comparison.
2. Generalized atrophy and chronic small vessel ischemia.

## 2020-07-05 DEATH — deceased
